# Patient Record
Sex: Female | Born: 1965 | Race: White | Hispanic: No | Smoking: Current every day smoker
Health system: Southern US, Community
[De-identification: ages and names within clinical notes are randomized; demographics above are authoritative.]

## PROBLEM LIST (undated history)

## (undated) DIAGNOSIS — IMO0002 Reserved for concepts with insufficient information to code with codable children: Secondary | ICD-10-CM

## (undated) DIAGNOSIS — M549 Dorsalgia, unspecified: Secondary | ICD-10-CM

## (undated) DIAGNOSIS — K859 Acute pancreatitis without necrosis or infection, unspecified: Secondary | ICD-10-CM

## (undated) DIAGNOSIS — G8929 Other chronic pain: Secondary | ICD-10-CM

## (undated) DIAGNOSIS — R109 Unspecified abdominal pain: Secondary | ICD-10-CM

## (undated) DIAGNOSIS — K529 Noninfective gastroenteritis and colitis, unspecified: Secondary | ICD-10-CM

## (undated) DIAGNOSIS — F1910 Other psychoactive substance abuse, uncomplicated: Secondary | ICD-10-CM

## (undated) DIAGNOSIS — M329 Systemic lupus erythematosus, unspecified: Secondary | ICD-10-CM

## (undated) DIAGNOSIS — J449 Chronic obstructive pulmonary disease, unspecified: Secondary | ICD-10-CM

## (undated) DIAGNOSIS — C55 Malignant neoplasm of uterus, part unspecified: Secondary | ICD-10-CM

## (undated) DIAGNOSIS — K279 Peptic ulcer, site unspecified, unspecified as acute or chronic, without hemorrhage or perforation: Secondary | ICD-10-CM

## (undated) DIAGNOSIS — D126 Benign neoplasm of colon, unspecified: Secondary | ICD-10-CM

## (undated) DIAGNOSIS — R112 Nausea with vomiting, unspecified: Secondary | ICD-10-CM

## (undated) HISTORY — PX: SHOULDER SURGERY: SHX246

---
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
## 1985-05-10 HISTORY — PX: CHOLECYSTECTOMY: SHX55

## 1994-05-10 DIAGNOSIS — C55 Malignant neoplasm of uterus, part unspecified: Secondary | ICD-10-CM

## 1994-05-10 HISTORY — PX: ABDOMINAL EXPLORATION SURGERY: SHX538

## 1994-05-10 HISTORY — PX: ABDOMINAL HYSTERECTOMY: SHX81

## 1994-05-10 HISTORY — DX: Malignant neoplasm of uterus, part unspecified: C55

## 2000-10-21 ENCOUNTER — Encounter: Payer: Self-pay | Admitting: *Deleted

## 2000-10-21 ENCOUNTER — Emergency Department (HOSPITAL_COMMUNITY): Admission: EM | Admit: 2000-10-21 | Discharge: 2000-10-21 | Payer: Self-pay | Admitting: *Deleted

## 2000-10-23 ENCOUNTER — Inpatient Hospital Stay (HOSPITAL_COMMUNITY): Admission: EM | Admit: 2000-10-23 | Discharge: 2000-10-28 | Payer: Self-pay | Admitting: Internal Medicine

## 2000-10-23 ENCOUNTER — Encounter: Payer: Self-pay | Admitting: Internal Medicine

## 2000-10-25 ENCOUNTER — Encounter: Payer: Self-pay | Admitting: General Surgery

## 2001-04-10 ENCOUNTER — Encounter: Payer: Self-pay | Admitting: Emergency Medicine

## 2001-04-11 ENCOUNTER — Encounter: Payer: Self-pay | Admitting: Family Medicine

## 2001-04-12 ENCOUNTER — Inpatient Hospital Stay (HOSPITAL_COMMUNITY): Admission: EM | Admit: 2001-04-12 | Discharge: 2001-04-14 | Payer: Self-pay | Admitting: Emergency Medicine

## 2002-01-29 ENCOUNTER — Inpatient Hospital Stay (HOSPITAL_COMMUNITY): Admission: EM | Admit: 2002-01-29 | Discharge: 2002-02-02 | Payer: Self-pay | Admitting: Emergency Medicine

## 2002-01-29 ENCOUNTER — Encounter: Payer: Self-pay | Admitting: Family Medicine

## 2003-02-14 ENCOUNTER — Encounter: Payer: Self-pay | Admitting: Emergency Medicine

## 2003-02-14 ENCOUNTER — Emergency Department (HOSPITAL_COMMUNITY): Admission: EM | Admit: 2003-02-14 | Discharge: 2003-02-14 | Payer: Self-pay | Admitting: Emergency Medicine

## 2010-02-07 DIAGNOSIS — D126 Benign neoplasm of colon, unspecified: Secondary | ICD-10-CM

## 2010-02-07 HISTORY — PX: ESOPHAGOGASTRODUODENOSCOPY: SHX1529

## 2010-02-07 HISTORY — PX: COLONOSCOPY W/ BIOPSIES: SHX1374

## 2010-02-07 HISTORY — DX: Benign neoplasm of colon, unspecified: D12.6

## 2010-05-10 DIAGNOSIS — K859 Acute pancreatitis without necrosis or infection, unspecified: Secondary | ICD-10-CM

## 2010-05-10 HISTORY — DX: Acute pancreatitis without necrosis or infection, unspecified: K85.90

## 2010-05-31 ENCOUNTER — Encounter: Payer: Self-pay | Admitting: Family Medicine

## 2010-11-08 DIAGNOSIS — K529 Noninfective gastroenteritis and colitis, unspecified: Secondary | ICD-10-CM

## 2010-11-08 HISTORY — DX: Noninfective gastroenteritis and colitis, unspecified: K52.9

## 2011-02-26 ENCOUNTER — Encounter (HOSPITAL_COMMUNITY): Payer: Self-pay | Admitting: *Deleted

## 2011-02-26 ENCOUNTER — Emergency Department (HOSPITAL_COMMUNITY)
Admission: EM | Admit: 2011-02-26 | Discharge: 2011-02-26 | Disposition: A | Discharge status: Home / Self Care | Payer: Self-pay | Attending: Emergency Medicine | Admitting: Emergency Medicine

## 2011-02-26 ENCOUNTER — Encounter: Payer: Self-pay | Admitting: Emergency Medicine

## 2011-02-26 DIAGNOSIS — F17200 Nicotine dependence, unspecified, uncomplicated: Secondary | ICD-10-CM | POA: Insufficient documentation

## 2011-02-26 DIAGNOSIS — K921 Melena: Secondary | ICD-10-CM | POA: Insufficient documentation

## 2011-02-26 DIAGNOSIS — R143 Flatulence: Secondary | ICD-10-CM | POA: Insufficient documentation

## 2011-02-26 DIAGNOSIS — M329 Systemic lupus erythematosus, unspecified: Secondary | ICD-10-CM | POA: Insufficient documentation

## 2011-02-26 DIAGNOSIS — R142 Eructation: Secondary | ICD-10-CM | POA: Insufficient documentation

## 2011-02-26 DIAGNOSIS — R1032 Left lower quadrant pain: Secondary | ICD-10-CM | POA: Insufficient documentation

## 2011-02-26 DIAGNOSIS — K5289 Other specified noninfective gastroenteritis and colitis: Secondary | ICD-10-CM | POA: Insufficient documentation

## 2011-02-26 DIAGNOSIS — K529 Noninfective gastroenteritis and colitis, unspecified: Secondary | ICD-10-CM

## 2011-02-26 DIAGNOSIS — R141 Gas pain: Secondary | ICD-10-CM | POA: Insufficient documentation

## 2011-02-26 DIAGNOSIS — R112 Nausea with vomiting, unspecified: Secondary | ICD-10-CM | POA: Insufficient documentation

## 2011-02-26 HISTORY — DX: Systemic lupus erythematosus, unspecified: M32.9

## 2011-02-26 HISTORY — DX: Noninfective gastroenteritis and colitis, unspecified: K52.9

## 2011-02-26 HISTORY — DX: Reserved for concepts with insufficient information to code with codable children: IMO0002

## 2011-02-26 LAB — DIFFERENTIAL
Basophils Absolute: 0 10*3/uL (ref 0.0–0.1)
Eosinophils Absolute: 0 10*3/uL (ref 0.0–0.7)
Lymphs Abs: 1.4 10*3/uL (ref 0.7–4.0)
Monocytes Absolute: 0.2 10*3/uL (ref 0.1–1.0)
Monocytes Relative: 2 % — ABNORMAL LOW (ref 3–12)
Neutro Abs: 8.1 10*3/uL — ABNORMAL HIGH (ref 1.7–7.7)

## 2011-02-26 LAB — BASIC METABOLIC PANEL
Calcium: 9.3 mg/dL (ref 8.4–10.5)
Creatinine, Ser: 0.53 mg/dL (ref 0.50–1.10)
GFR calc Af Amer: >90 mL/min (ref >90)
GFR calc non Af Amer: >90 mL/min (ref >90)
Sodium: 144 mEq/L (ref 135–145)

## 2011-02-26 LAB — LIPASE, BLOOD: Lipase: 18 U/L (ref 11–59)

## 2011-02-26 LAB — CBC
HCT: 44.7 % (ref 36.0–46.0)
MCH: 31.8 pg (ref 26.0–34.0)
MCHC: 34.5 g/dL (ref 30.0–36.0)
MCV: 92.4 fL (ref 78.0–100.0)
Platelets: 200 10*3/uL (ref 150–400)
RDW: 12 % (ref 11.5–15.5)
WBC: 9.7 10*3/uL (ref 4.0–10.5)

## 2011-02-26 MED ORDER — ONDANSETRON HCL 4 MG/2ML IJ SOLN
4.0000 mg | Freq: Once | INTRAMUSCULAR | Status: AC
  Administered 2011-02-26: 4 mg via INTRAVENOUS
  Filled 2011-02-26: qty 2

## 2011-02-26 MED ORDER — HYDROMORPHONE HCL 1 MG/ML IJ SOLN
1.0000 mg | Freq: Once | INTRAMUSCULAR | Status: AC
  Administered 2011-02-26: 1 mg via INTRAVENOUS
  Filled 2011-02-26: qty 1

## 2011-02-26 MED ORDER — CIPROFLOXACIN HCL 750 MG PO TABS: 750.0000 mg | ORAL_TABLET | Freq: Two times a day (BID) | ORAL | Status: AC

## 2011-02-26 MED ORDER — MORPHINE SULFATE 4 MG/ML IJ SOLN
4.0000 mg | Freq: Once | INTRAMUSCULAR | Status: AC
  Administered 2011-02-26: 4 mg via INTRAVENOUS
  Filled 2011-02-26: qty 1

## 2011-02-26 MED ORDER — OXYCODONE-ACETAMINOPHEN 5-325 MG PO TABS: 1.0000 | ORAL_TABLET | ORAL | Status: AC | PRN

## 2011-02-26 MED ORDER — METRONIDAZOLE 500 MG PO TABS: 500.0000 mg | ORAL_TABLET | Freq: Four times a day (QID) | ORAL | Status: AC

## 2011-02-26 MED ORDER — SODIUM CHLORIDE 0.9 % IV BOLUS (SEPSIS)
1000.0000 mL | Freq: Once | INTRAVENOUS | Status: AC
  Administered 2011-02-26: 1000 mL via INTRAVENOUS

## 2011-02-26 NOTE — ED Notes (Signed)
Pt a/ox4. Resp even and unlabored. NAD at this time. D/C instruction reviewed with pt and pt verbalized understanding. Pt to lobby with son to transport home.

## 2011-02-26 NOTE — ED Provider Notes (Signed)
History     CSN: [id number] Arrival date & time: 02/26/2011 11:58 AM   First MD Initiated Contact with Patient 02/26/11 1245      Chief Complaint  Patient presents with  . Abdominal Pain    (Consider location/radiation/quality/duration/timing/severity/associated sxs/prior treatment) Patient is a 45 y.o. female presenting with abdominal pain. The history is provided by the patient.  Abdominal Pain The primary symptoms of the illness include abdominal pain, fatigue, nausea, vomiting and diarrhea. The primary symptoms of the illness do not include shortness of breath or dysuria.  Symptoms associated with the illness do not include back pain.   patient states that she's had nausea vomiting and diarrhea for the last 2 days. She has a history of colitis, that she states was diagnosed in [place] in 6 months ago. She states that she's been told that she will have some blood in the stool occasionally. She states that she is going to see a gastroenterologist in town here now. No fevers. She has some left lower quadrant pain. His typical pain for her colitis. No trauma. Pain was gradual onset and has been constant. It is a dull pain. Sleep by nothing. She states she also has a history of lupus.  Past Medical History  Diagnosis Date  . Colitis   . Lupus     Past Surgical History  Procedure Date  . Abdominal hysterectomy   . Appendectomy   . Cholecystectomy   . Cesarean section   . Abdominal surgery     History reviewed. No pertinent family history.  History  Substance Use Topics  . Smoking status: Current Everyday Smoker  . Smokeless tobacco: Not on file  . Alcohol Use: No    OB History    Grav Para Term Preterm Abortions TAB SAB Ect Mult Living                  Review of Systems  Constitutional: Positive for appetite change and fatigue. Negative for activity change.  HENT: Negative for neck stiffness.   Eyes: Negative for pain.  Respiratory: Negative for chest tightness and  shortness of breath.   Cardiovascular: Negative for chest pain and leg swelling.  Gastrointestinal: Positive for nausea, vomiting, abdominal pain, diarrhea and blood in stool.  Genitourinary: Negative for dysuria and flank pain.  Musculoskeletal: Negative for back pain.  Skin: Negative for rash and wound.  Neurological: Negative for weakness, numbness and headaches.  Hematological: Negative for adenopathy. Does not bruise/bleed easily.  Psychiatric/Behavioral: Negative for behavioral problems.    Allergies  Ibuprofen; Penicillins; and Toradol  Home Medications   Current Outpatient Rx  Name Route Sig Dispense Refill  . ALPRAZOLAM 2 MG PO TABS Oral Take 2 mg by mouth 3 (three) times daily.      [name] AMITRIPTYLINE HCL 75 MG PO TABS Oral Take 75 mg by mouth at bedtime.      [name] CIPROFLOXACIN HCL 750 MG PO TABS Oral Take 1 tablet (750 mg total) by mouth 2 (two) times daily. 28 tablet 0  . METRONIDAZOLE 500 MG PO TABS Oral Take 1 tablet (500 mg total) by mouth 4 (four) times daily. 56 tablet 0  . OXYCODONE-ACETAMINOPHEN 5-325 MG PO TABS Oral Take 1 tablet by mouth every 4 (four) hours as needed for pain. 10 tablet 0    BP 116/55  Pulse 51  Temp 98.5 F (36.9 C)  Resp 18  Ht 5\' 8"  (1.727 m)  Wt 150 lb (68.04 kg)  BMI 22.81 kg/m2  SpO2 95%  Physical Exam  Nursing note and vitals reviewed. Constitutional: She is oriented to person, place, and time. She appears well-developed and well-nourished.  HENT:  Head: Normocephalic and atraumatic.  Eyes: EOM are normal. Pupils are equal, round, and reactive to light.  Neck: Normal range of motion. Neck supple.  Cardiovascular: Normal rate, regular rhythm and normal heart sounds.   No murmur heard. Pulmonary/Chest: Effort normal and breath sounds normal. No respiratory distress. She has no wheezes. She has no rales.  Abdominal: Soft. Bowel sounds are normal. She exhibits distension. There is no tenderness. There is no rebound and no guarding.        Mild left lower quadrant tenderness without rebound or guarding.  Musculoskeletal: Normal range of motion.  Neurological: She is alert and oriented to person, place, and time. No cranial nerve deficit.  Skin: Skin is warm and dry.  Psychiatric: She has a normal mood and affect. Her speech is normal.    ED Course  Procedures (including critical care time)  Labs Reviewed  CBC - Abnormal; Notable for the following:    Hemoglobin 15.4 (*)    All other components within normal limits  DIFFERENTIAL - Abnormal; Notable for the following:    Neutrophils Relative 84 (*)    Monocytes Relative 2 (*)    Neutro Abs 8.1 (*)    All other components within normal limits  BASIC METABOLIC PANEL - Abnormal; Notable for the following:    Glucose, Bld 111 (*)    All other components within normal limits  LIPASE, BLOOD   No results found.   1. Colitis      3pm. Patient now states that she has had pancrease problems when her colitis flares up. Will check a lipase.  MDM  Acute on chronic abdominal pain for the last 6 months. She states that she's been diagnosed with colitis an outside hospital after an extensive workup. States it flared up last few days. She doesn't know more about her colitis. She states that she has had nausea vomiting and some bloody diarrhea. No fevers. Lab work is reassuring here. The total white count is normal. Patient feels much better after treatment. She states that he needs she needs to follow with Dr. Dionicia Abler here. He has previously been a pain clinic but says that she wants to get off medicines now. I have not repeated imaging. Upon treating it like it is a diverticulitis. Her a few pain pills. She will need followup. Rectal exam was deferred due to a stable hemoglobin and patient preference.        Juliet Rude. Rubin Payor, MD 02/26/11 1554

## 2011-02-26 NOTE — ED Notes (Signed)
Pt c/o continued abdominal pain, and n/v. Was seen here earlier for the same.

## 2011-02-26 NOTE — ED Notes (Signed)
Pt. States nausea and pain increased.  MD notified and orders received.

## 2011-02-26 NOTE — ED Notes (Signed)
Pt. States pain is unchanged with pain medication and EDP notified.

## 2011-02-26 NOTE — ED Notes (Signed)
Patient does not need anything at this time. 

## 2011-02-26 NOTE — ED Notes (Signed)
Pt c/o n/v/d with blood in stool and abd pain. H/s colitis.

## 2011-02-27 ENCOUNTER — Emergency Department (HOSPITAL_COMMUNITY): Payer: Self-pay

## 2011-02-27 ENCOUNTER — Emergency Department (HOSPITAL_COMMUNITY)
Admission: EM | Admit: 2011-02-27 | Discharge: 2011-02-27 | Disposition: A | Discharge status: Home / Self Care | Payer: Self-pay | Attending: Emergency Medicine | Admitting: Emergency Medicine

## 2011-02-27 ENCOUNTER — Encounter (HOSPITAL_COMMUNITY): Payer: Self-pay | Admitting: *Deleted

## 2011-02-27 DIAGNOSIS — F17200 Nicotine dependence, unspecified, uncomplicated: Secondary | ICD-10-CM | POA: Insufficient documentation

## 2011-02-27 DIAGNOSIS — R197 Diarrhea, unspecified: Secondary | ICD-10-CM | POA: Insufficient documentation

## 2011-02-27 DIAGNOSIS — R1013 Epigastric pain: Secondary | ICD-10-CM | POA: Insufficient documentation

## 2011-02-27 DIAGNOSIS — R1032 Left lower quadrant pain: Secondary | ICD-10-CM | POA: Insufficient documentation

## 2011-02-27 DIAGNOSIS — R112 Nausea with vomiting, unspecified: Secondary | ICD-10-CM | POA: Insufficient documentation

## 2011-02-27 DIAGNOSIS — K5289 Other specified noninfective gastroenteritis and colitis: Secondary | ICD-10-CM | POA: Insufficient documentation

## 2011-02-27 DIAGNOSIS — M329 Systemic lupus erythematosus, unspecified: Secondary | ICD-10-CM | POA: Insufficient documentation

## 2011-02-27 LAB — CBC
HCT: 44.6 % (ref 36.0–46.0)
HCT: 38.2 % (ref 36.0–46.0)
MCH: 32 pg (ref 26.0–34.0)
MCHC: 33.2 g/dL (ref 30.0–36.0)
MCV: 92.5 fL (ref 78.0–100.0)
Platelets: 248 10*3/uL (ref 150–400)
Platelets: 233 10*3/uL (ref 150–400)
RBC: 4.13 MIL/uL (ref 3.87–5.11)
RDW: 12.1 % (ref 11.5–15.5)
WBC: 8 10*3/uL (ref 4.0–10.5)

## 2011-02-27 LAB — COMPREHENSIVE METABOLIC PANEL
ALT: 13 U/L (ref 0–35)
AST: 15 U/L (ref 0–37)
Albumin: 3.8 g/dL (ref 3.5–5.2)
Alkaline Phosphatase: 59 U/L (ref 39–117)
Chloride: 107 mEq/L (ref 96–112)
Potassium: 3.3 mEq/L — ABNORMAL LOW (ref 3.5–5.1)
Sodium: 140 mEq/L (ref 135–145)
Total Bilirubin: 0.3 mg/dL (ref 0.3–1.2)
Total Protein: 6.9 g/dL (ref 6.0–8.3)

## 2011-02-27 LAB — BASIC METABOLIC PANEL
CO2: 25 mEq/L (ref 19–32)
Calcium: 9.3 mg/dL (ref 8.4–10.5)
Chloride: 106 mEq/L (ref 96–112)
Creatinine, Ser: 0.59 mg/dL (ref 0.50–1.10)
GFR calc Af Amer: >90 mL/min (ref >90)
Sodium: 142 mEq/L (ref 135–145)

## 2011-02-27 LAB — DIFFERENTIAL
Band Neutrophils: 0 % (ref 0–10)
Basophils Absolute: 0 10*3/uL (ref 0.0–0.1)
Basophils Absolute: 0 10*3/uL (ref 0.0–0.1)
Basophils Relative: 0 % (ref 0–1)
Eosinophils Absolute: 0 10*3/uL (ref 0.0–0.7)
Eosinophils Relative: 0 % (ref 0–5)
Eosinophils Relative: 0 % (ref 0–5)
Lymphocytes Relative: 15 % (ref 12–46)
Metamyelocytes Relative: 0 %
Monocytes Absolute: 0.2 10*3/uL (ref 0.1–1.0)
Monocytes Absolute: 0.2 10*3/uL (ref 0.1–1.0)
Myelocytes: 0 %
Neutro Abs: 6.6 10*3/uL (ref 1.7–7.7)
nRBC: 0 /100 WBC

## 2011-02-27 LAB — URINALYSIS, ROUTINE W REFLEX MICROSCOPIC
Glucose, UA: NEGATIVE mg/dL
Hgb urine dipstick: NEGATIVE
Ketones, ur: 15 mg/dL — AB
Leukocytes, UA: NEGATIVE
Protein, ur: NEGATIVE mg/dL
pH: 6.5 (ref 5.0–8.0)

## 2011-02-27 MED ORDER — SODIUM CHLORIDE 0.9 % IV SOLN: INTRAVENOUS | Status: DC

## 2011-02-27 MED ORDER — ALBUTEROL SULFATE (5 MG/ML) 0.5% IN NEBU
2.5000 mg | INHALATION_SOLUTION | Freq: Once | RESPIRATORY_TRACT | Status: AC
  Administered 2011-02-27: 2.5 mg via RESPIRATORY_TRACT
  Filled 2011-02-27: qty 0.5

## 2011-02-27 MED ORDER — ONDANSETRON HCL 4 MG/2ML IJ SOLN
4.0000 mg | Freq: Once | INTRAMUSCULAR | Status: AC
  Administered 2011-02-27: 4 mg via INTRAVENOUS
  Filled 2011-02-27: qty 2

## 2011-02-27 MED ORDER — HYDROCODONE-ACETAMINOPHEN 5-325 MG PO TABS: 1.0000 | ORAL_TABLET | ORAL | Status: AC | PRN

## 2011-02-27 MED ORDER — HYDROMORPHONE HCL 1 MG/ML IJ SOLN
1.0000 mg | Freq: Once | INTRAMUSCULAR | Status: AC
  Administered 2011-02-27: 1 mg via INTRAVENOUS
  Filled 2011-02-27: qty 1

## 2011-02-27 MED ORDER — METRONIDAZOLE 500 MG PO TABS
500.0000 mg | ORAL_TABLET | Freq: Once | ORAL | Status: AC
  Administered 2011-02-27: 500 mg via ORAL
  Filled 2011-02-27: qty 1

## 2011-02-27 MED ORDER — HYDROCODONE-ACETAMINOPHEN 5-325 MG PO TABS: 1.0000 | ORAL_TABLET | ORAL | Status: DC | PRN

## 2011-02-27 MED ORDER — SODIUM CHLORIDE 0.9 % IV BOLUS (SEPSIS)
1000.0000 mL | Freq: Once | INTRAVENOUS | Status: AC
  Administered 2011-02-27: 1000 mL via INTRAVENOUS

## 2011-02-27 MED ORDER — PANTOPRAZOLE SODIUM 40 MG IV SOLR: 40.0000 mg | Freq: Once | INTRAVENOUS | Status: DC

## 2011-02-27 MED ORDER — SODIUM CHLORIDE 0.9 % IV BOLUS (SEPSIS): 1000.0000 mL | Freq: Once | INTRAVENOUS | Status: DC

## 2011-02-27 MED ORDER — SODIUM CHLORIDE 0.9 % IV SOLN
Freq: Once | INTRAVENOUS | Status: AC
  Administered 2011-02-27: 02:00:00 via INTRAVENOUS

## 2011-02-27 MED ORDER — ONDANSETRON HCL 4 MG PO TABS: 4.0000 mg | ORAL_TABLET | Freq: Four times a day (QID) | ORAL | Status: AC

## 2011-02-27 MED ORDER — IOHEXOL 300 MG/ML  SOLN
100.0000 mL | Freq: Once | INTRAMUSCULAR | Status: AC | PRN
  Administered 2011-02-27: 100 mL via INTRAVENOUS

## 2011-02-27 MED ORDER — SODIUM CHLORIDE 0.9 % IV SOLN
Freq: Once | INTRAVENOUS | Status: AC
  Administered 2011-02-27: 05:00:00 via INTRAVENOUS

## 2011-02-27 MED ORDER — ONDANSETRON HCL 4 MG PO TABS: 4.0000 mg | ORAL_TABLET | Freq: Four times a day (QID) | ORAL | Status: DC

## 2011-02-27 MED ORDER — CIPROFLOXACIN HCL 250 MG PO TABS
500.0000 mg | ORAL_TABLET | Freq: Once | ORAL | Status: AC
  Administered 2011-02-27: 500 mg via ORAL
  Filled 2011-02-27: qty 2

## 2011-02-27 MED ORDER — PANTOPRAZOLE SODIUM 40 MG IV SOLR
40.0000 mg | Freq: Once | INTRAVENOUS | Status: AC
  Administered 2011-02-27: 40 mg via INTRAVENOUS
  Filled 2011-02-27: qty 40

## 2011-02-27 MED ORDER — IPRATROPIUM BROMIDE 0.02 % IN SOLN
0.5000 mg | Freq: Once | RESPIRATORY_TRACT | Status: AC
  Administered 2011-02-27: 0.5 mg via RESPIRATORY_TRACT
  Filled 2011-02-27: qty 2.5

## 2011-02-27 NOTE — ED Notes (Signed)
Pain at 5 and nausea is eased.  Asking for another liter of fld.

## 2011-02-27 NOTE — ED Notes (Signed)
Alert, talking, Pt has vomited x1 in tx room, app 50 cc green fld app1.5 hours ago.

## 2011-02-27 NOTE — ED Notes (Signed)
Pt c/o abd pain with n/v/d; pt states she was just seen this am for same complaint; pt states she startes vomiting after she wakes up

## 2011-02-27 NOTE — ED Notes (Signed)
Alert at time of d/c, called for ride.  Had been sleeping.  IV removed and will wait in lobby for her ride.

## 2011-02-27 NOTE — ED Notes (Signed)
Alert, talking, no distress at present.  Son at bedside.

## 2011-02-27 NOTE — ED Notes (Signed)
Pain at 8 and cont to have nausea.

## 2011-02-27 NOTE — ED Provider Notes (Signed)
History     CSN: [id number] Arrival date & time: 02/27/2011  4:51 PM   First MD Initiated Contact with Patient 02/27/11 1656      Chief Complaint  Patient presents with  . Abdominal Pain  . Nausea  . Emesis  . Diarrhea    (Consider location/radiation/quality/duration/timing/severity/associated sxs/prior treatment) HPI... left lower quadrant pain for several days.  Was in emergency department earlier today. Has history of diverticulitis and colitis. No fever, chills, dysuria, vomiting, diarrhea. He makes pain better or worse. No radiation of pain. Pain is vague in nature and moderate in intensity  Past Medical History  Diagnosis Date  . Colitis   . Lupus     Past Surgical History  Procedure Date  . Abdominal hysterectomy   . Appendectomy   . Cholecystectomy   . Cesarean section   . Abdominal surgery     History reviewed. No pertinent family history.  History  Substance Use Topics  . Smoking status: Current Everyday Smoker  . Smokeless tobacco: Not on file  . Alcohol Use: No    OB History    Grav Para Term Preterm Abortions TAB SAB Ect Mult Living                  Review of Systems  All other systems reviewed and are negative.    Allergies  Honey bee venom; Ibuprofen; Penicillins; and Toradol  Home Medications   Current Outpatient Rx  Name Route Sig Dispense Refill  . ALBUTEROL SULFATE HFA 108 (90 BASE) MCG/ACT IN AERS Inhalation Inhale 2 puffs into the lungs every 6 (six) hours as needed. For shortness of breath     . ALPRAZOLAM 2 MG PO TABS Oral Take 2 mg by mouth 3 (three) times daily.      [name] AMITRIPTYLINE HCL 75 MG PO TABS Oral Take 75 mg by mouth at bedtime as needed. For sleep    . CIPROFLOXACIN HCL 750 MG PO TABS Oral Take 1 tablet (750 mg total) by mouth 2 (two) times daily. 28 tablet 0  . PROMETHAZINE HCL 25 MG RE SUPP Rectal Place 25 mg rectally every 6 (six) hours as needed. For nausea     . HYDROCODONE-ACETAMINOPHEN 5-325 MG PO TABS Oral  Take 1-2 tablets by mouth every 4 (four) hours as needed for pain. 20 tablet 0  . METRONIDAZOLE 500 MG PO TABS Oral Take 1 tablet (500 mg total) by mouth 4 (four) times daily. 56 tablet 0  . ONDANSETRON HCL 4 MG PO TABS Oral Take 1 tablet (4 mg total) by mouth every 6 (six) hours. 12 tablet 0  . OXYCODONE-ACETAMINOPHEN 5-325 MG PO TABS Oral Take 1 tablet by mouth every 4 (four) hours as needed for pain. 10 tablet 0    BP 128/69  Pulse 50  Temp(Src) 99.2 F (37.3 C) (Oral)  Resp 20  SpO2 98%  Physical Exam  Nursing note and vitals reviewed. Constitutional: She is oriented to person, place, and time. She appears well-developed and well-nourished.  HENT:  Head: Normocephalic and atraumatic.  Eyes: Conjunctivae and EOM are normal. Pupils are equal, round, and reactive to light.  Neck: Normal range of motion. Neck supple.  Cardiovascular: Normal rate and regular rhythm.   Pulmonary/Chest: Effort normal and breath sounds normal.  Abdominal: Soft. Bowel sounds are normal.       Minimal left lower quadrant tenderness, no guarding  Musculoskeletal: Normal range of motion.  Neurological: She is alert and oriented to person, place, and  time.  Skin: Skin is warm and dry.  Psychiatric: She has a normal mood and affect.    ED Course  Procedures (including critical care time)  Labs Reviewed  COMPREHENSIVE METABOLIC PANEL - Abnormal; Notable for the following:    Potassium 3.3 (*)    All other components within normal limits  URINALYSIS, ROUTINE W REFLEX MICROSCOPIC - Abnormal; Notable for the following:    Ketones, ur 15 (*)    All other components within normal limits  CBC  DIFFERENTIAL  LIPASE, BLOOD   Ct Abdomen Pelvis W Contrast  02/27/2011  *RADIOLOGY REPORT*  Clinical Data: Epigastric abdominal pain with nausea and diarrhea. Question diverticulitis.  Post hysterectomy.  CT ABDOMEN AND PELVIS WITH CONTRAST  Technique:  Multidetector CT imaging of the abdomen and pelvis was  performed following the standard protocol during bolus administration of intravenous contrast.  Contrast: OMNIPAQUE IOHEXOL 300 MG/ML IV SOLN  Comparison: None available.  Findings: Minimal atelectatic changes right lung base.  No free intraperitoneal air.  Post cholecystectomy.  Mild fatty infiltration liver.  No focal hepatic lesion.  No hydronephrosis or focal renal lesion.  No splenic or pancreatic lesion.  No extraluminal bowel inflammatory process or free fluid. Small amount of fluid within the duodenum slightly heterogeneous but without discrete ulcer.  Atherosclerotic type changes of the aorta without aneurysmal dilation.  Atherosclerotic type changes iliac arteries.  No pelvic or abdominal adenopathy.  Post hysterectomy.  Left pelvic side wall the 4.2 x 2.7 cm structure may be related to the ovary and be further assessed on follow-up ultrasound in 2 months to confirm resolution.  Post hysterectomy seroma/lymphocele less likely consideration.  No bony destructive changes.  Degenerative changes lower lumbar spine.  IMPRESSION: No extraluminal bowel inflammatory process, free fluid or free air noted.  Appendix not visualized.  Nonspecific left pelvic sidewall 4.2 x 2.7 cm structure may be related to the ovary and can be assessed on follow-up ultrasound 2 months.  Please see above.  Original Report Authenticated By: Fuller Canada, M.D.     1. Abdominal pain       MDM  CT scan shows a 4 x 2.7 centimeter area in left pelvis. This was discussed with the patient and her husband.  Patient understands she needs an ultrasound in 2 months. Medications for pain and nausea. No acute abdomen on discharge      .[name]  Donnetta Hutching, MD 02/27/11 2221

## 2011-02-27 NOTE — ED Provider Notes (Signed)
History     CSN: [id number] Arrival date & time: 02/27/2011 12:15 AM   First MD Initiated Contact with Patient 02/27/11 0054      Chief Complaint  Patient presents with  . Abdominal Pain  . Nausea  . Emesis    (Consider location/radiation/quality/duration/timing/severity/associated sxs/prior treatment) HPI Comments: Seen 81 Patient with a history of colitis diagnosed in [place], [place]  6 months ago who was seen here yesterday afternoon for abdominal pain, nausea and vomiting that she had been experiencing for the last two days. She was treated with IVF, analgesics, antiemetics. Labs were unremarkable. She was discharged with Rx for analgesics, antibiotics, antiemetics. She states she went home and began vomiting again and was unable to take any of the medicines. Her pain is in the LLQ and is typical of her colitis pain. She denies any fever, chills, chest pain, shortness of breath. She states she felt better when she was in the ER earlier. Additional medical history includes lupus. She has had C-section, chole, apendectomy, hysterectomy.   Patient is a 45 y.o. female presenting with abdominal pain. The history is provided by the patient.  Abdominal Pain The primary symptoms of the illness include abdominal pain, nausea and vomiting. The primary symptoms of the illness do not include fever. The current episode started more than 2 days ago. The onset of the illness was gradual. The problem has been gradually worsening.  Symptoms associated with the illness do not include chills. Associated symptoms comments: Nausea and vomiting.    Past Medical History  Diagnosis Date  . Colitis   . Lupus     Past Surgical History  Procedure Date  . Abdominal hysterectomy   . Appendectomy   . Cholecystectomy   . Cesarean section   . Abdominal surgery     History reviewed. No pertinent family history.  History  Substance Use Topics  . Smoking status: Current Everyday Smoker  . Smokeless  tobacco: Not on file  . Alcohol Use: No    OB History    Grav Para Term Preterm Abortions TAB SAB Ect Mult Living                  Review of Systems  Constitutional: Negative for fever and chills.  Gastrointestinal: Positive for nausea, vomiting, abdominal pain, blood in stool and abdominal distention.  All other systems reviewed and are negative.    Allergies  Ibuprofen; Penicillins; and Toradol  Home Medications   Current Outpatient Rx  Name Route Sig Dispense Refill  . ALPRAZOLAM 2 MG PO TABS Oral Take 2 mg by mouth 3 (three) times daily.      [name] AMITRIPTYLINE HCL 75 MG PO TABS Oral Take 75 mg by mouth at bedtime.      [name] CIPROFLOXACIN HCL 750 MG PO TABS Oral Take 1 tablet (750 mg total) by mouth 2 (two) times daily. 28 tablet 0  . METRONIDAZOLE 500 MG PO TABS Oral Take 1 tablet (500 mg total) by mouth 4 (four) times daily. 56 tablet 0  . OXYCODONE-ACETAMINOPHEN 5-325 MG PO TABS Oral Take 1 tablet by mouth every 4 (four) hours as needed for pain. 10 tablet 0    BP 109/56  Pulse 85  Temp(Src) 99.1 F (37.3 C) (Oral)  Resp 20  SpO2 97%  Physical Exam  Nursing note and vitals reviewed. Constitutional: She is oriented to person, place, and time. She appears well-developed and well-nourished. No distress.  HENT:  Head: Normocephalic and atraumatic.  Right Ear:  External ear normal.  Left Ear: External ear normal.  Mouth/Throat: Oropharynx is clear and moist.  Eyes: EOM are normal.  Neck: Normal range of motion. Neck supple.  Cardiovascular: Regular rhythm, normal heart sounds and intact distal pulses.   Pulmonary/Chest: Effort normal and breath sounds normal.  Abdominal: She exhibits no distension and no mass. There is tenderness. There is no rebound and no guarding.       Left lower quadrant tenderness without rebound or guarding.  Musculoskeletal: Normal range of motion.  Neurological: She is alert and oriented to person, place, and time.  Skin: Skin is warm and  dry.    ED Course  Procedures (including critical care time) Results for orders placed during the hospital encounter of 02/27/11  CBC      Component Value Range   WBC 8.0  4.0 - 10.5 (K/uL)   RBC 4.81  3.87 - 5.11 (MIL/uL)   Hemoglobin 14.8  12.0 - 15.0 (g/dL)   HCT 04.5  40.9 - 81.1 (%)   MCV 92.7  78.0 - 100.0 (fL)   MCH 30.8  26.0 - 34.0 (pg)   MCHC 33.2  30.0 - 36.0 (g/dL)   RDW 91.4  78.2 - 95.6 (%)   Platelets 248  150 - 400 (K/uL)  DIFFERENTIAL      Component Value Range   Neutrophils Relative 83 (*) 43 - 77 (%)   Neutro Abs 6.6  1.7 - 7.7 (K/uL)   Lymphocytes Relative 15  12 - 46 (%)   Lymphs Abs 1.2  0.7 - 4.0 (K/uL)   Monocytes Relative 2 (*) 3 - 12 (%)   Monocytes Absolute 0.2  0.1 - 1.0 (K/uL)   Eosinophils Relative 0  0 - 5 (%)   Eosinophils Absolute 0.0  0.0 - 0.7 (K/uL)   Basophils Relative 0  0 - 1 (%)   Basophils Absolute 0.0  0.0 - 0.1 (K/uL)  BASIC METABOLIC PANEL      Component Value Range   Sodium 142  135 - 145 (mEq/L)   Potassium 3.9  3.5 - 5.1 (mEq/L)   Chloride 106  96 - 112 (mEq/L)   CO2 25  19 - 32 (mEq/L)   Glucose, Bld 116 (*) 70 - 99 (mg/dL)   BUN 15  6 - 23 (mg/dL)   Creatinine, Ser 2.13  0.50 - 1.10 (mg/dL)   Calcium 9.3  8.4 - 08.6 (mg/dL)   GFR calc non Af Amer >90  >90 (mL/min)   GFR calc Af Amer >90  >90 (mL/min)   visit    MDM  Patient with h/o colitis here with recurrent pain. Seen in the ER less than 12 hours ago with the same c/o. Unable to keep down medications. This visit given IVF, antiemetics, analgesics, and begun on antibiotics for colitis. Patient able to keep down fluids while here. Ambulated to the bathroom without assistance. Labs were repeated which were unchanged. Pt stable in ED with no significant deterioration in condition.The patient appears reasonably screened and/or stabilized for discharge and I doubt any other medical condition or other [facility] requiring further screening, evaluation, or treatment in the ED at this  time prior to discharge. MDM Reviewed: nursing note, vitals and previous chart Reviewed previous: labs Interpretation: labs Total time providing critical care: 40.           Nicoletta Dress. Colon Branch, MD 02/27/11 [phone]

## 2011-02-27 NOTE — ED Notes (Signed)
Seen here earlier for colitis.  Says she started vomiting after leaving ER.  Took phenergan suppository , and cont to vomit.  Alert, cooperative.

## 2011-05-16 ENCOUNTER — Emergency Department (HOSPITAL_COMMUNITY)
Admission: EM | Admit: 2011-05-16 | Discharge: 2011-05-16 | Disposition: A | Discharge status: Home / Self Care | Payer: Self-pay | Attending: Emergency Medicine | Admitting: Emergency Medicine

## 2011-05-16 ENCOUNTER — Emergency Department (HOSPITAL_COMMUNITY): Payer: Self-pay

## 2011-05-16 ENCOUNTER — Encounter (HOSPITAL_COMMUNITY): Payer: Self-pay | Admitting: *Deleted

## 2011-05-16 DIAGNOSIS — F17200 Nicotine dependence, unspecified, uncomplicated: Secondary | ICD-10-CM | POA: Insufficient documentation

## 2011-05-16 DIAGNOSIS — Z9079 Acquired absence of other genital organ(s): Secondary | ICD-10-CM | POA: Insufficient documentation

## 2011-05-16 DIAGNOSIS — R10814 Left lower quadrant abdominal tenderness: Secondary | ICD-10-CM | POA: Insufficient documentation

## 2011-05-16 DIAGNOSIS — K5289 Other specified noninfective gastroenteritis and colitis: Secondary | ICD-10-CM | POA: Insufficient documentation

## 2011-05-16 DIAGNOSIS — R1032 Left lower quadrant pain: Secondary | ICD-10-CM | POA: Insufficient documentation

## 2011-05-16 DIAGNOSIS — R197 Diarrhea, unspecified: Secondary | ICD-10-CM | POA: Insufficient documentation

## 2011-05-16 DIAGNOSIS — R112 Nausea with vomiting, unspecified: Secondary | ICD-10-CM | POA: Insufficient documentation

## 2011-05-16 DIAGNOSIS — M329 Systemic lupus erythematosus, unspecified: Secondary | ICD-10-CM | POA: Insufficient documentation

## 2011-05-16 HISTORY — DX: Reserved for concepts with insufficient information to code with codable children: IMO0002

## 2011-05-16 LAB — DIFFERENTIAL
Basophils Relative: 0 % (ref 0–1)
Eosinophils Absolute: 0 10*3/uL (ref 0.0–0.7)
Lymphs Abs: 1.5 10*3/uL (ref 0.7–4.0)
Monocytes Absolute: 0.2 10*3/uL (ref 0.1–1.0)
Monocytes Relative: 2 % — ABNORMAL LOW (ref 3–12)
Neutro Abs: 9.3 10*3/uL — ABNORMAL HIGH (ref 1.7–7.7)
Neutrophils Relative %: 84 % — ABNORMAL HIGH (ref 43–77)

## 2011-05-16 LAB — COMPREHENSIVE METABOLIC PANEL
Albumin: 5 g/dL (ref 3.5–5.2)
Alkaline Phosphatase: 84 U/L (ref 39–117)
BUN: 17 mg/dL (ref 6–23)
Chloride: 98 mEq/L (ref 96–112)
Creatinine, Ser: 0.55 mg/dL (ref 0.50–1.10)
GFR calc Af Amer: >90 mL/min (ref >90)
Glucose, Bld: 138 mg/dL — ABNORMAL HIGH (ref 70–99)
Potassium: 4.8 mEq/L (ref 3.5–5.1)
Total Bilirubin: 0.4 mg/dL (ref 0.3–1.2)

## 2011-05-16 LAB — URINE MICROSCOPIC-ADD ON

## 2011-05-16 LAB — CBC
HCT: 48 % — ABNORMAL HIGH (ref 36.0–46.0)
Hemoglobin: 16.5 g/dL — ABNORMAL HIGH (ref 12.0–15.0)
MCH: 31.3 pg (ref 26.0–34.0)
MCHC: 34.4 g/dL (ref 30.0–36.0)
RBC: 5.27 MIL/uL — ABNORMAL HIGH (ref 3.87–5.11)

## 2011-05-16 LAB — URINALYSIS, ROUTINE W REFLEX MICROSCOPIC
Ketones, ur: >80 mg/dL — AB
Leukocytes, UA: NEGATIVE
Nitrite: NEGATIVE
Protein, ur: 30 mg/dL — AB
Urobilinogen, UA: 0.2 mg/dL (ref 0.0–1.0)

## 2011-05-16 MED ORDER — METOCLOPRAMIDE HCL 5 MG/ML IJ SOLN
10.0000 mg | Freq: Once | INTRAMUSCULAR | Status: AC
  Administered 2011-05-16: 10 mg via INTRAVENOUS

## 2011-05-16 MED ORDER — OXYCODONE-ACETAMINOPHEN 5-325 MG PO TABS: 2.0000 | ORAL_TABLET | ORAL | Status: AC | PRN

## 2011-05-16 MED ORDER — IOHEXOL 300 MG/ML  SOLN
100.0000 mL | Freq: Once | INTRAMUSCULAR | Status: AC | PRN
  Administered 2011-05-16: 100 mL via INTRAVENOUS

## 2011-05-16 MED ORDER — HYDROMORPHONE HCL PF 1 MG/ML IJ SOLN
1.0000 mg | Freq: Once | INTRAMUSCULAR | Status: AC
  Administered 2011-05-16: 1 mg via INTRAVENOUS
  Filled 2011-05-16: qty 1

## 2011-05-16 MED ORDER — METOCLOPRAMIDE HCL 5 MG/ML IJ SOLN
INTRAMUSCULAR | Status: AC
  Filled 2011-05-16: qty 2

## 2011-05-16 MED ORDER — ONDANSETRON HCL 4 MG/2ML IJ SOLN
4.0000 mg | Freq: Once | INTRAMUSCULAR | Status: AC
  Administered 2011-05-16: 4 mg via INTRAVENOUS
  Filled 2011-05-16: qty 2

## 2011-05-16 MED ORDER — PANTOPRAZOLE SODIUM 40 MG IV SOLR
40.0000 mg | Freq: Once | INTRAVENOUS | Status: AC
  Administered 2011-05-16: 40 mg via INTRAVENOUS
  Filled 2011-05-16: qty 40

## 2011-05-16 MED ORDER — SODIUM CHLORIDE 0.45 % IV SOLN: INTRAVENOUS | Status: DC

## 2011-05-16 MED ORDER — SODIUM CHLORIDE 0.9 % IV BOLUS (SEPSIS)
1000.0000 mL | Freq: Once | INTRAVENOUS | Status: AC
  Administered 2011-05-16: 1000 mL via INTRAVENOUS

## 2011-05-16 MED ORDER — HYDROMORPHONE HCL PF 1 MG/ML IJ SOLN
INTRAMUSCULAR | Status: AC
  Filled 2011-05-16: qty 1

## 2011-05-16 MED ORDER — HYDROMORPHONE HCL PF 1 MG/ML IJ SOLN
1.0000 mg | Freq: Once | INTRAMUSCULAR | Status: AC
  Administered 2011-05-16: 1 mg via INTRAVENOUS

## 2011-05-16 MED ORDER — PROMETHAZINE HCL 25 MG PO TABS: 12.5000 mg | ORAL_TABLET | Freq: Four times a day (QID) | ORAL | Status: DC | PRN

## 2011-05-16 MED ORDER — OXYCODONE-ACETAMINOPHEN 5-325 MG PO TABS: 1.0000 | ORAL_TABLET | ORAL | Status: AC | PRN

## 2011-05-16 NOTE — ED Provider Notes (Signed)
History   This chart was scribed for [employer]. Colon Branch, MD by Sofie Rower. The patient was seen in room APA09/APA09 and the patient's care was started at 3:20PM.    CSN: [id number]  Arrival date & time 05/16/11  1426   First MD Initiated Contact with Patient 05/16/11 1505      No chief complaint on file.   (Consider location/radiation/quality/duration/timing/severity/associated sxs/prior treatment) HPI  Andrea Nelson is a 46 y.o. female who presents to the Emergency Department complaining of moderate, constant abdominal pain onset one week ago located in the LLQ with associated symptoms of nausea, vomiting, diarrhea, trouble with bowel movements. Pt has a history of colitis, colonoscopy (1 yr ago October) and hysterectomy. Pt denies any other medical problems.  PCP is Dr. Sherwood Gambler. OB/GYN is Dr. Emelda Fear.   Past Medical History  Diagnosis Date  . Colitis   . Lupus     Past Surgical History  Procedure Date  . Abdominal hysterectomy   . Appendectomy   . Cholecystectomy   . Cesarean section   . Abdominal surgery     No family history on file.  History  Substance Use Topics  . Smoking status: Current Everyday Smoker  . Smokeless tobacco: Not on file  . Alcohol Use: No    OB History    Grav Para Term Preterm Abortions TAB SAB Ect Mult Living                  Review of Systems  10 Systems reviewed and are negative for acute change except as noted in the HPI.   Allergies  Honey bee venom; Ibuprofen; Penicillins; and Toradol  Home Medications   Current Outpatient Rx  Name Route Sig Dispense Refill  . ALBUTEROL SULFATE HFA 108 (90 BASE) MCG/ACT IN AERS Inhalation Inhale 2 puffs into the lungs every 6 (six) hours as needed. For shortness of breath     . ALPRAZOLAM 2 MG PO TABS Oral Take 2 mg by mouth 3 (three) times daily.      [name] AMITRIPTYLINE HCL 75 MG PO TABS Oral Take 75 mg by mouth at bedtime as needed. For sleep    . PROMETHAZINE HCL 25 MG RE SUPP Rectal Place 25  mg rectally every 6 (six) hours as needed. For nausea       There were no vitals taken for this visit.  Physical Exam  Nursing note and vitals reviewed. Constitutional: She is oriented to person, place, and time. She appears well-developed and well-nourished. No distress.  HENT:  Head: Normocephalic and atraumatic.  Nose: Nose normal.  Eyes: EOM are normal. Pupils are equal, round, and reactive to light.  Neck: Normal range of motion. Neck supple. No tracheal deviation present.  Cardiovascular: Normal rate, regular rhythm and normal heart sounds.   Pulmonary/Chest: Effort normal and breath sounds normal. No respiratory distress.  Abdominal: Soft. Bowel sounds are normal. She exhibits no distension. There is tenderness. There is no rebound and no guarding.       Located in the LLQ.  Musculoskeletal: Normal range of motion. She exhibits no edema.  Neurological: She is alert and oriented to person, place, and time. No sensory deficit.  Skin: Skin is warm and dry.  Psychiatric: She has a normal mood and affect. Her behavior is normal.    ED Course  Procedures (including critical care time)  DIAGNOSTIC STUDIES: Oxygen Saturation is 98% on room air, normal by my interpretation.    COORDINATION OF CARE:  Results for orders placed during the hospital encounter of 05/16/11  CBC      Component Value Range   WBC 11.0 (*) 4.0 - 10.5 (K/uL)   RBC 5.27 (*) 3.87 - 5.11 (MIL/uL)   Hemoglobin 16.5 (*) 12.0 - 15.0 (g/dL)   HCT 29.5 (*) 28.4 - 46.0 (%)   MCV 91.1  78.0 - 100.0 (fL)   MCH 31.3  26.0 - 34.0 (pg)   MCHC 34.4  30.0 - 36.0 (g/dL)   RDW 13.2  44.0 - 10.2 (%)   Platelets 303  150 - 400 (K/uL)  DIFFERENTIAL      Component Value Range   Neutrophils Relative 84 (*) 43 - 77 (%)   Neutro Abs 9.3 (*) 1.7 - 7.7 (K/uL)   Lymphocytes Relative 14  12 - 46 (%)   Lymphs Abs 1.5  0.7 - 4.0 (K/uL)   Monocytes Relative 2 (*) 3 - 12 (%)   Monocytes Absolute 0.2  0.1 - 1.0 (K/uL)    Eosinophils Relative 0  0 - 5 (%)   Eosinophils Absolute 0.0  0.0 - 0.7 (K/uL)   Basophils Relative 0  0 - 1 (%)   Basophils Absolute 0.0  0.0 - 0.1 (K/uL)  COMPREHENSIVE METABOLIC PANEL      Component Value Range   Sodium 135  135 - 145 (mEq/L)   Potassium 4.8  3.5 - 5.1 (mEq/L)   Chloride 98  96 - 112 (mEq/L)   CO2 26  19 - 32 (mEq/L)   Glucose, Bld 138 (*) 70 - 99 (mg/dL)   BUN 17  6 - 23 (mg/dL)   Creatinine, Ser 7.25  0.50 - 1.10 (mg/dL)   Calcium 36.6 (*) 8.4 - 10.5 (mg/dL)   Total Protein 9.6 (*) 6.0 - 8.3 (g/dL)   Albumin 5.0  3.5 - 5.2 (g/dL)   AST 26  0 - 37 (U/L)   ALT 20  0 - 35 (U/L)   Alkaline Phosphatase 84  39 - 117 (U/L)   Total Bilirubin 0.4  0.3 - 1.2 (mg/dL)   GFR calc non Af Amer >90  >90 (mL/min)   GFR calc Af Amer >90  >90 (mL/min)  URINALYSIS, ROUTINE W REFLEX MICROSCOPIC      Component Value Range   Color, Urine YELLOW  YELLOW    APPearance CLOUDY (*) CLEAR    Specific Gravity, Urine >1.030 (*) 1.005 - 1.030    pH 6.0  5.0 - 8.0    Glucose, UA NEGATIVE  NEGATIVE (mg/dL)   Hgb urine dipstick TRACE (*) NEGATIVE    Bilirubin Urine MODERATE (*) NEGATIVE    Ketones, ur >80 (*) NEGATIVE (mg/dL)   Protein, ur 30 (*) NEGATIVE (mg/dL)   Urobilinogen, UA 0.2  0.0 - 1.0 (mg/dL)   Nitrite NEGATIVE  NEGATIVE    Leukocytes, UA NEGATIVE  NEGATIVE   URINE MICROSCOPIC-ADD ON      Component Value Range   Squamous Epithelial / LPF FEW (*) RARE    WBC, UA 7-10  <3 (WBC/hpf)   RBC / HPF 7-10  <3 (RBC/hpf)   Bacteria, UA MANY (*) RARE    Ct Abdomen Pelvis W Contrast  05/16/2011  *RADIOLOGY REPORT*  Clinical Data: Recurrent left-sided abdominal pain.  CT ABDOMEN AND PELVIS WITH CONTRAST  Technique:  Multidetector CT imaging of the abdomen and pelvis was performed following the standard protocol during bolus administration of intravenous contrast.  Contrast: OMNIPAQUE IOHEXOL 300 MG/ML IV SOLN  Comparison: CT scan  02/27/2011.  Findings: The lung bases are clear.  No  pleural effusion.  The liver is unremarkable.  No focal lesions or intrahepatic biliary dilatation.  The gallbladder is surgically absent.  Mild common bile duct prominence is stable.  The pancreas is normal. The spleen is normal in size.  No focal lesions.  The adrenal glands and kidneys are normal and stable.  The stomach, duodenum, small bowel and colon demonstrate no significant abnormalities.  No inflammatory changes or mass lesions.  There are stable upper abdominal lymph nodes but no adenopathy.  The aorta is normal in caliber.  Moderate distal atherosclerotic changes, particularly for the patient's age.  The uterus is surgically absent.  Both ovaries are still present and appear normal.  The bladder is normal.  No pelvic mass, adenopathy or free pelvic fluid collections.  No inguinal mass or hernia.  The bony pelvis is intact.  IMPRESSION: No acute abdominal/pelvic findings, mass lesions or adenopathy.  Original Report Authenticated By: P. Loralie Champagne, M.D.   3:25PM- EDP at bedside discusses treatment plan. 6:48PM- Recheck. EDP at bedside discusses treatment plan. 7:04PM- Recheck. EDP at bedside discusses treatment plan.      MDM  Patient with left sided abdominal pain that she has had for over a week. She had a CT done which showed a possible complicated cystic lesion to the left ovary. She had been treated with analgesics and has some relief of the pain, now wit recurrence. Given IV, analgesics and antiemetic with improvement. Repeat CT with no comment on previous LLQ ovarian findings. Spoke with radiologist, Dr. Renato Gails who reviewed both CTs and advised previous CT was most likely an ovarian cyst which has resolved.Patientand her husband informed of clinical course, understand medical decision-making process, and agree with plan.Pt feels improved after observation and/or treatment in ED.Pt stable in ED with no significant deterioration in condition.The patient appears reasonably screened and/or  stabilized for discharge and I doubt any other medical condition or other [facility] requiring further screening, evaluation, or treatment in the ED at this time prior to discharge.  I personally performed the services described in this documentation, which was scribed in my presence. The recorded information has been reviewed and considered.   MDM Reviewed: nursing note, vitals and previous chart Reviewed previous: CT scan Interpretation: labs and CT scan Consults: radiology.      Nicoletta Dress. Colon Branch, MD 05/18/11 7829

## 2011-05-16 NOTE — ED Notes (Signed)
Pt states LLQ pain. Pt states "they found a spot to the same area last time I was here" Pt states she began having pain again a week ago. Nausea and Vomiting also.

## 2011-05-17 MED FILL — Oxycodone w/ Acetaminophen Tab 5-325 MG: ORAL | Qty: 6 | Status: AC

## 2011-09-07 ENCOUNTER — Emergency Department (HOSPITAL_COMMUNITY): Payer: Medicaid Other

## 2011-09-07 ENCOUNTER — Emergency Department (HOSPITAL_COMMUNITY)
Admission: EM | Admit: 2011-09-07 | Discharge: 2011-09-07 | Disposition: A | Discharge status: Home / Self Care | Payer: Medicaid Other | Attending: Emergency Medicine | Admitting: Emergency Medicine

## 2011-09-07 ENCOUNTER — Other Ambulatory Visit: Payer: Self-pay

## 2011-09-07 ENCOUNTER — Encounter (HOSPITAL_COMMUNITY): Payer: Self-pay

## 2011-09-07 DIAGNOSIS — R079 Chest pain, unspecified: Secondary | ICD-10-CM | POA: Insufficient documentation

## 2011-09-07 DIAGNOSIS — M329 Systemic lupus erythematosus, unspecified: Secondary | ICD-10-CM | POA: Insufficient documentation

## 2011-09-07 DIAGNOSIS — R112 Nausea with vomiting, unspecified: Secondary | ICD-10-CM | POA: Insufficient documentation

## 2011-09-07 DIAGNOSIS — R1013 Epigastric pain: Secondary | ICD-10-CM | POA: Insufficient documentation

## 2011-09-07 DIAGNOSIS — R0602 Shortness of breath: Secondary | ICD-10-CM | POA: Insufficient documentation

## 2011-09-07 LAB — CBC
HCT: 44.1 % (ref 36.0–46.0)
Hemoglobin: 14.6 g/dL (ref 12.0–15.0)
MCH: 30.2 pg (ref 26.0–34.0)
MCHC: 33.1 g/dL (ref 30.0–36.0)
MCV: 91.1 fL (ref 78.0–100.0)
Platelets: 275 10*3/uL (ref 150–400)
RBC: 4.84 MIL/uL (ref 3.87–5.11)
RDW: 12.6 % (ref 11.5–15.5)
WBC: 6.8 10*3/uL (ref 4.0–10.5)

## 2011-09-07 LAB — COMPREHENSIVE METABOLIC PANEL
ALT: 11 U/L (ref 0–35)
AST: 13 U/L (ref 0–37)
Albumin: 4.4 g/dL (ref 3.5–5.2)
Alkaline Phosphatase: 76 U/L (ref 39–117)
BUN: 13 mg/dL (ref 6–23)
CO2: 26 mEq/L (ref 19–32)
Calcium: 10.2 mg/dL (ref 8.4–10.5)
Chloride: 101 mEq/L (ref 96–112)
Creatinine, Ser: 0.53 mg/dL (ref 0.50–1.10)
GFR calc Af Amer: >90 mL/min (ref >90)
GFR calc non Af Amer: >90 mL/min (ref >90)
Glucose, Bld: 111 mg/dL — ABNORMAL HIGH (ref 70–99)
Potassium: 4.2 mEq/L (ref 3.5–5.1)
Sodium: 139 mEq/L (ref 135–145)
Total Bilirubin: 0.4 mg/dL (ref 0.3–1.2)
Total Protein: 8.4 g/dL — ABNORMAL HIGH (ref 6.0–8.3)

## 2011-09-07 LAB — TROPONIN I: Troponin I: <0.3 ng/mL (ref <0.30)

## 2011-09-07 LAB — LIPASE, BLOOD: Lipase: 14 U/L (ref 11–59)

## 2011-09-07 MED ORDER — ONDANSETRON HCL 4 MG/2ML IJ SOLN
4.0000 mg | Freq: Once | INTRAMUSCULAR | Status: AC
  Administered 2011-09-07: 4 mg via INTRAVENOUS
  Filled 2011-09-07: qty 2

## 2011-09-07 MED ORDER — MORPHINE SULFATE 4 MG/ML IJ SOLN
6.0000 mg | Freq: Once | INTRAMUSCULAR | Status: AC
  Administered 2011-09-07: 6 mg via INTRAVENOUS
  Filled 2011-09-07: qty 2

## 2011-09-07 MED ORDER — HYDROMORPHONE HCL PF 1 MG/ML IJ SOLN
1.0000 mg | Freq: Once | INTRAMUSCULAR | Status: AC
  Administered 2011-09-07: 1 mg via INTRAVENOUS
  Filled 2011-09-07: qty 1

## 2011-09-07 MED ORDER — ONDANSETRON HCL 4 MG PO TABS: 4.0000 mg | ORAL_TABLET | Freq: Four times a day (QID) | ORAL | Status: AC

## 2011-09-07 MED ORDER — GI COCKTAIL ~~LOC~~
10.0000 mL | Freq: Once | ORAL | Status: AC
  Administered 2011-09-07: 10 mL via ORAL
  Filled 2011-09-07: qty 30

## 2011-09-07 NOTE — ED Provider Notes (Signed)
History    46 year old female with upper abdominal and lower sternal pain. Onset yesterday. Gradual onset. Does not radiate. Patient has been having preceding nausea and vomiting since about Saturday. Multiple episodes. Nonbilious and nonbloody. No diarrhea. No fevers or chills. No sick contacts. Patient is a smoker and occasionally for short of breath, but no significant change from her baseline. Urinary complaints. No unusual vaginal bleeding or discharge. CSN: [id number]  Arrival date & time 09/07/11  1226   First MD Initiated Contact with Patient 09/07/11 1256      Chief Complaint  Patient presents with  . Chest Pain    (Consider location/radiation/quality/duration/timing/severity/associated sxs/prior treatment) HPI  Past Medical History  Diagnosis Date  . Colitis   . Lupus   . DDD (degenerative disc disease)     Past Surgical History  Procedure Date  . Abdominal hysterectomy   . Appendectomy   . Cholecystectomy   . Cesarean section   . Abdominal surgery     No family history on file.  History  Substance Use Topics  . Smoking status: Current Everyday Smoker  . Smokeless tobacco: Not on file  . Alcohol Use: No    OB History    Grav Para Term Preterm Abortions TAB SAB Ect Mult Living                  Review of Systems   Review of symptoms negative unless otherwise noted in HPI.   Allergies  Honey bee venom; Ibuprofen; Ketorolac tromethamine; and Penicillins  Home Medications   Current Outpatient Rx  Name Route Sig Dispense Refill  . ALBUTEROL SULFATE HFA 108 (90 BASE) MCG/ACT IN AERS Inhalation Inhale 2 puffs into the lungs every 6 (six) hours as needed. For shortness of breath       BP 97/64  Pulse 107  Temp(Src) 98 F (36.7 C) (Oral)  Resp 20  Ht 5\' 8"  (1.727 m)  Wt 155 lb (70.308 kg)  BMI 23.57 kg/m2  SpO2 99%  Physical Exam  Nursing note and vitals reviewed. Constitutional: She appears well-developed and well-nourished. No distress.      In bed. No acute distress.  HENT:  Head: Normocephalic and atraumatic.  Eyes: Conjunctivae are normal. Right eye exhibits no discharge. Left eye exhibits no discharge.  Neck: Neck supple.  Cardiovascular: Normal rate, regular rhythm and normal heart sounds.  Exam reveals no gallop and no friction rub.   No murmur heard. Pulmonary/Chest: Effort normal and breath sounds normal. No respiratory distress. She exhibits no tenderness.  Abdominal: Soft. She exhibits no distension. There is tenderness.       Mild tenderness in the epigastric region without rebound or guarding. No distention. No mass palpated.  Genitourinary:       CVA tenderness.  Musculoskeletal: She exhibits no edema and no tenderness.  Neurological: She is alert.  Skin: Skin is warm and dry. She is not diaphoretic.  Psychiatric: She has a normal mood and affect. Her behavior is normal. Thought content normal.    ED Course  Procedures (including critical care time)  Labs Reviewed  COMPREHENSIVE METABOLIC PANEL - Abnormal; Notable for the following:    Glucose, Bld 111 (*)    Total Protein 8.4 (*)    All other components within normal limits  TROPONIN I  LIPASE, BLOOD  CBC  LAB REPORT - SCANNED   Dg Chest 2 View  09/07/2011  *RADIOLOGY REPORT*  Clinical Data: Chest pain  CHEST - 2 VIEW  Comparison: None.  Findings: Lungs are clear. No pleural effusion or pneumothorax.  Cardiomediastinal silhouette is within normal limits.  Mild degenerative changes of the visualized thoracolumbar spine.  IMPRESSION: Normal chest radiographs.  Original Report Authenticated By: Charline Bills, M.D.    EKG:  Rhythm: Sinus tachycardia Rate: 103 Axis: normal Intervals: Left atrial enlargement ST segments: Nonspecific ST changes. There is T-wave flattening inferiorly and laterally.   1. Abdominal pain   2. Nausea and vomiting       MDM  Procedure female with abdominal pain. Epigastric. Consider cardiac etiology but doubt.  Troponin is normal and EKG not suggestive. Suspect related to abdominal strain from recent vomiting. Mild tenderness on exam. Doubt surgical abdomen.        Raeford Razor, MD 09/09/11 1406

## 2011-09-07 NOTE — ED Notes (Signed)
Pt reports having n/v since Saturday, cp -mid sternal since yesterday.  bil arm numbness since yesterday.  +smoker.  Sob at times.

## 2011-09-07 NOTE — ED Notes (Signed)
Patient transported to X-ray. NAD noted. Pt reports her pain has not eased.

## 2011-09-07 NOTE — Discharge Instructions (Signed)
Abdominal Pain Abdominal pain can be caused by many things. Your caregiver decides the seriousness of your pain by an examination and possibly blood tests and X-rays. Many cases can be observed and treated at home. Most abdominal pain is not caused by a disease and will probably improve without treatment. However, in many cases, more time must pass before a clear cause of the pain can be found. Before that point, it may not be known if you need more testing, or if hospitalization or surgery is needed. HOME CARE INSTRUCTIONS   Do not take laxatives unless directed by your caregiver.   Take pain medicine only as directed by your caregiver.   Only take over-the-counter or prescription medicines for pain, discomfort, or fever as directed by your caregiver.   Try a clear liquid diet (broth, tea, or water) for as long as directed by your caregiver. Slowly move to a bland diet as tolerated.  SEEK IMMEDIATE MEDICAL CARE IF:   The pain does not go away.   You have a fever.   You keep throwing up (vomiting).   The pain is felt only in portions of the abdomen. Pain in the right side could possibly be appendicitis. In an adult, pain in the left lower portion of the abdomen could be colitis or diverticulitis.   You pass bloody or black tarry stools.  MAKE SURE YOU:   Understand these instructions.   Will watch your condition.   Will get help right away if you are not doing well or get worse.  Document Released: 02/03/2005 Document Revised: 04/15/2011 Document Reviewed: 12/13/2007 [employer] Patient Information 2012 [employer], [place] and Vomiting Nausea means you feel sick to your stomach. Throwing up (vomiting) is a reflex where stomach contents come out of your mouth. HOME CARE   Take medicine as told by your doctor.   Do not force yourself to eat. However, you do need to drink fluids.   If you feel like eating, eat a normal diet as told by your doctor.   Eat rice, wheat, potatoes,  bread, lean meats, yogurt, fruits, and vegetables.   Avoid high-fat foods.   Drink enough fluids to keep your pee (urine) clear or pale yellow.   Ask your doctor how to replace body fluid losses (rehydrate). Signs of body fluid loss (dehydration) include:   Feeling very thirsty.   Dry lips and mouth.   Feeling dizzy.   Dark pee.   Peeing less than normal.   Feeling confused.   Fast breathing or heart rate.  GET HELP RIGHT AWAY IF:   You have blood in your throw up.   You have black or bloody poop (stool).   You have a bad headache or stiff neck.   You feel confused.   You have bad belly (abdominal) pain.   You have chest pain or trouble breathing.   You do not pee at least once every 8 hours.   You have cold, clammy skin.   You keep throwing up after 24 to 48 hours.   You have a fever.  MAKE SURE YOU:   Understand these instructions.   Will watch your condition.   Will get help right away if you are not doing well or get worse.  Document Released: 10/13/2007 Document Revised: 04/15/2011 Document Reviewed: 09/25/2010 [facility] Patient Information 2012 [place], [place].Nausea and Vomiting Nausea means you feel sick to your stomach. Throwing up (vomiting) is a reflex where stomach contents come out of your mouth. HOME CARE  Take medicine as told by your doctor.   Do not force yourself to eat. However, you do need to drink fluids.   If you feel like eating, eat a normal diet as told by your doctor.   Eat rice, wheat, potatoes, bread, lean meats, yogurt, fruits, and vegetables.   Avoid high-fat foods.   Drink enough fluids to keep your pee (urine) clear or pale yellow.   Ask your doctor how to replace body fluid losses (rehydrate). Signs of body fluid loss (dehydration) include:   Feeling very thirsty.   Dry lips and mouth.   Feeling dizzy.   Dark pee.   Peeing less than normal.   Feeling confused.   Fast breathing or heart rate.  GET HELP  RIGHT AWAY IF:   You have blood in your throw up.   You have black or bloody poop (stool).   You have a bad headache or stiff neck.   You feel confused.   You have bad belly (abdominal) pain.   You have chest pain or trouble breathing.   You do not pee at least once every 8 hours.   You have cold, clammy skin.   You keep throwing up after 24 to 48 hours.   You have a fever.  MAKE SURE YOU:   Understand these instructions.   Will watch your condition.   Will get help right away if you are not doing well or get worse.  Document Released: 10/13/2007 Document Revised: 04/15/2011 Document Reviewed: 09/25/2010 [facility] Patient Information 2012 [place], [place].[name]

## 2011-09-07 NOTE — ED Notes (Signed)
Patient transported to X-ray 

## 2011-09-17 ENCOUNTER — Encounter (HOSPITAL_COMMUNITY): Payer: Self-pay | Admitting: *Deleted

## 2011-09-17 ENCOUNTER — Emergency Department (HOSPITAL_COMMUNITY)
Admission: EM | Admit: 2011-09-17 | Discharge: 2011-09-17 | Disposition: A | Discharge status: Home / Self Care | Payer: Medicaid Other | Attending: Emergency Medicine | Admitting: Emergency Medicine

## 2011-09-17 ENCOUNTER — Emergency Department (HOSPITAL_COMMUNITY): Payer: Medicaid Other

## 2011-09-17 DIAGNOSIS — R079 Chest pain, unspecified: Secondary | ICD-10-CM | POA: Insufficient documentation

## 2011-09-17 DIAGNOSIS — M329 Systemic lupus erythematosus, unspecified: Secondary | ICD-10-CM | POA: Insufficient documentation

## 2011-09-17 DIAGNOSIS — Z79899 Other long term (current) drug therapy: Secondary | ICD-10-CM | POA: Insufficient documentation

## 2011-09-17 DIAGNOSIS — R5381 Other malaise: Secondary | ICD-10-CM | POA: Insufficient documentation

## 2011-09-17 DIAGNOSIS — R1013 Epigastric pain: Secondary | ICD-10-CM | POA: Insufficient documentation

## 2011-09-17 DIAGNOSIS — R112 Nausea with vomiting, unspecified: Secondary | ICD-10-CM | POA: Insufficient documentation

## 2011-09-17 DIAGNOSIS — R000 Tachycardia, unspecified: Secondary | ICD-10-CM | POA: Insufficient documentation

## 2011-09-17 DIAGNOSIS — K861 Other chronic pancreatitis: Secondary | ICD-10-CM | POA: Insufficient documentation

## 2011-09-17 LAB — DIFFERENTIAL
Basophils Absolute: 0 10*3/uL (ref 0.0–0.1)
Eosinophils Relative: 0 % (ref 0–5)
Lymphocytes Relative: 18 % (ref 12–46)
Monocytes Absolute: 0.2 10*3/uL (ref 0.1–1.0)
Monocytes Relative: 2 % — ABNORMAL LOW (ref 3–12)
Neutro Abs: 6.4 10*3/uL (ref 1.7–7.7)

## 2011-09-17 LAB — COMPREHENSIVE METABOLIC PANEL
Albumin: 4.8 g/dL (ref 3.5–5.2)
Alkaline Phosphatase: 78 U/L (ref 39–117)
BUN: 14 mg/dL (ref 6–23)
Chloride: 98 mEq/L (ref 96–112)
Creatinine, Ser: 0.56 mg/dL (ref 0.50–1.10)
GFR calc Af Amer: >90 mL/min (ref >90)
GFR calc non Af Amer: >90 mL/min (ref >90)
Glucose, Bld: 134 mg/dL — ABNORMAL HIGH (ref 70–99)
Potassium: 3.8 mEq/L (ref 3.5–5.1)
Total Bilirubin: 0.3 mg/dL (ref 0.3–1.2)

## 2011-09-17 LAB — CBC
HCT: 44.7 % (ref 36.0–46.0)
Hemoglobin: 15.1 g/dL — ABNORMAL HIGH (ref 12.0–15.0)
MCHC: 33.8 g/dL (ref 30.0–36.0)
MCV: 90.1 fL (ref 78.0–100.0)
RDW: 12.7 % (ref 11.5–15.5)
WBC: 8.1 10*3/uL (ref 4.0–10.5)

## 2011-09-17 LAB — URINALYSIS, ROUTINE W REFLEX MICROSCOPIC
Glucose, UA: NEGATIVE mg/dL
Ketones, ur: NEGATIVE mg/dL
Leukocytes, UA: NEGATIVE
Nitrite: NEGATIVE
Protein, ur: 30 mg/dL — AB
Urobilinogen, UA: 0.2 mg/dL (ref 0.0–1.0)

## 2011-09-17 LAB — URINE MICROSCOPIC-ADD ON

## 2011-09-17 LAB — TROPONIN I: Troponin I: <0.3 ng/mL (ref <0.30)

## 2011-09-17 MED ORDER — ONDANSETRON HCL 4 MG/2ML IJ SOLN
INTRAMUSCULAR | Status: AC
  Filled 2011-09-17: qty 2

## 2011-09-17 MED ORDER — METOCLOPRAMIDE HCL 5 MG/ML IJ SOLN
10.0000 mg | Freq: Once | INTRAMUSCULAR | Status: AC
  Administered 2011-09-17: 10 mg via INTRAVENOUS
  Filled 2011-09-17: qty 2

## 2011-09-17 MED ORDER — ONDANSETRON HCL 4 MG/2ML IJ SOLN: 4.0000 mg | Freq: Once | INTRAMUSCULAR | Status: DC

## 2011-09-17 MED ORDER — OXYCODONE-ACETAMINOPHEN 5-325 MG PO TABS: 1.0000 | ORAL_TABLET | Freq: Four times a day (QID) | ORAL | Status: DC | PRN

## 2011-09-17 MED ORDER — ONDANSETRON HCL 4 MG/2ML IJ SOLN
8.0000 mg | Freq: Once | INTRAMUSCULAR | Status: AC
  Administered 2011-09-17: 8 mg via INTRAVENOUS

## 2011-09-17 MED ORDER — SODIUM CHLORIDE 0.9 % IV SOLN
INTRAVENOUS | Status: DC
  Administered 2011-09-17: 14:00:00 via INTRAVENOUS

## 2011-09-17 MED ORDER — SODIUM CHLORIDE 0.9 % IV BOLUS (SEPSIS)
1000.0000 mL | Freq: Once | INTRAVENOUS | Status: AC
  Administered 2011-09-17: 1000 mL via INTRAVENOUS

## 2011-09-17 MED ORDER — HYDROMORPHONE HCL PF 1 MG/ML IJ SOLN
1.0000 mg | Freq: Once | INTRAMUSCULAR | Status: AC
  Administered 2011-09-17: 1 mg via INTRAVENOUS
  Filled 2011-09-17: qty 1

## 2011-09-17 MED ORDER — PROMETHAZINE HCL 25 MG PO TABS: 25.0000 mg | ORAL_TABLET | Freq: Four times a day (QID) | ORAL | Status: DC | PRN

## 2011-09-17 MED ORDER — OXYCODONE-ACETAMINOPHEN 5-325 MG PO TABS
2.0000 | ORAL_TABLET | Freq: Once | ORAL | Status: AC
  Administered 2011-09-17: 2 via ORAL
  Filled 2011-09-17: qty 2

## 2011-09-17 NOTE — ED Notes (Signed)
Pt c/o epigastric pain that started last pm, pain is associated with n/v, multiple episodes, pt actively vomiting in exam room, pt has hx of pancreatitis,

## 2011-09-17 NOTE — ED Notes (Signed)
Pt to radiology dept. Medicated for Pain and nausea prior to transport.

## 2011-09-17 NOTE — ED Notes (Signed)
MD at bedside. 

## 2011-09-17 NOTE — Discharge Instructions (Signed)
Acute Pancreatitis The pancreas is a large gland located behind your stomach. It produces (secretes) enzymes. These enzymes help digest food. It also releases the hormones glucagon and insulin. These hormones help regulate blood sugar. When the pancreas becomes inflamed, the disease is called pancreatitis. Inflammation of the pancreas occurs when enzymes from the pancreas begin attacking and digesting the pancreas. CAUSES  Most cases ofsudden onset (acute) pancreatitis are caused by:  Alcohol abuse.   Gallstones.  Other less common causes are:  Some medications.   Exposure to certain chemicals   Infection.   Damage caused by an accident (trauma).   Surgery of the belly (abdomen).  SYMPTOMS  Acute pancreatitis usually begins with pain in the upper abdomen and may radiate to the back. This pain may last a couple days. The constant pain varies from mild to severe. The acute form of this disease may vary from mild, nonspecific abdominal pain to profound shock with coma. About 1 in 5 cases are severe. These patients become dehydrated and develop low blood pressure. In severe cases, bleeding into the pancreas can lead to shock and death. The lungs, heart, and kidneys may fail. DIAGNOSIS  Your caregiver will form a clinical opinion after giving you an exam. Laboratory work is used to confirm this diagnosis. Often,a digestive enzyme from the pancreas (serum amylase) and other enzymes are elevated. Sugars and fats (lipids) in the blood may be elevated. There may also be changes in the following levels: calcium, magnesium, potassium, chloride and bicarbonate (chemicals in the blood). X-rays, a CT scan, or ultrasound of your abdomen may be necessary to search for other causes of your abdominal pain. TREATMENT  Most pancreatitis requires treatment of symptoms. Most acute attacks last a couple of days. Your caregiver can discuss the treatment options with you.  If complications occur, hospitalization  may be necessary for pain control and intravenous (IV) fluid replacement.   Sometimes, a tube may be put into the stomach to control vomiting.   Food may not be allowed for 3 to 4 days. This gives the pancreas time to rest. Giving the pancreas a rest means there is no stimulation that would produce more enzymes and cause more damage.   Medicines (antibiotics) that kill germs may be given if infection is the cause.   Sometimes, surgery may be required.   Following an acute attack, your caregiver will determine the cause, if possible, and offer suggestions to prevent recurrences.  HOME CARE INSTRUCTIONS   Eat smaller, more frequent meals. This reduces the amount of digestive juices the pancreas produces.   Decrease the amount of fat in your diet. This may help reduce loose, diarrheal stools.   Drink enough water and fluids to keep your urine clear or pale yellow. This is to avoid dehydration which can cause increased pain.   Talk to your caregiver about pain relievers or other medicines that may help.   Avoid anything that may have triggered your pancreatitis (for example, alcohol).   Follow the diet advised by your caregiver. Do not advance the diet too soon.   Take medicines as prescribed.   Get plenty of rest.   Check your blood sugar at home as directed by your caregiver.   If your caregiver has given you a follow-up appointment, it is very important to keep that appointment. Not keeping the appointment could result in a lasting (chronic) or permanent injury, pain, and disability. If there is any problem keeping the appointment, you must call to reschedule.    SEEK MEDICAL CARE IF:   You are not recovering in the time described by your caregiver.   You have persistent pain, weakness, or feel sick to your stomach (nauseous).   You have recovered and then have another bout of pain.  SEEK IMMEDIATE MEDICAL CARE IF:   You are unable to eat or keep fluids down.   Your pain  increases a lot or changes.   You have an oral temperature above 102 F (38.9 C), not controlled by medicine.   Your skin or the white part of your eyes look yellow (jaundice).   You develop vomiting.   You feel dizzy or faint.   Your blood sugar is high (over 300).  MAKE SURE YOU:   Understand these instructions.   Will watch your condition.   Will get help right away if you are not doing well or get worse.  Document Released: 04/26/2005 Document Revised: 04/15/2011 Document Reviewed: 12/08/2007 [employer] Patient Information 2012 [employer]. 

## 2011-09-17 NOTE — ED Notes (Signed)
Chest pain, nausea, vomiting since last night.

## 2011-09-17 NOTE — ED Provider Notes (Signed)
This chart was scribed for Dayton Bailiff, MD by Wallis Mart. The patient was seen in room APA02/APA02 and the patient's care was started at 12:19 PM.   CSN: [id number]  Arrival date & time 09/17/11  1139   First MD Initiated Contact with Patient 09/17/11 1155      Chief Complaint  Patient presents with  . Chest Pain    (Consider location/radiation/quality/duration/timing/severity/associated sxs/prior treatment) HPI  Andrea Nelson is a 46 y.o. female who presents to the Emergency Department complaining of sudden onset, persistence of constant, gradually worsening, moderate to severe epigastric pain onset last night. Pt c/o associated nausea and vomiting.  Pt w/ h/o colitis. Denies blood in urine or stool.  There are no other associated symptoms and no other alleviating or aggravating factors.   Past Medical History  Diagnosis Date  . Colitis   . Lupus   . DDD (degenerative disc disease)     Past Surgical History  Procedure Date  . Abdominal hysterectomy   . Appendectomy   . Cholecystectomy   . Cesarean section   . Abdominal surgery     No family history on file.  History  Substance Use Topics  . Smoking status: Current Everyday Smoker  . Smokeless tobacco: Not on file  . Alcohol Use: No    OB History    Grav Para Term Preterm Abortions TAB SAB Ect Mult Living                  Review of Systems  Constitutional: Positive for fatigue. Negative for fever, chills, activity change and appetite change.  HENT: Negative for congestion, sore throat, rhinorrhea, neck pain and neck stiffness.   Respiratory: Negative for cough and shortness of breath.   Cardiovascular: Negative for chest pain and palpitations.  Gastrointestinal: Positive for nausea, vomiting and abdominal pain. Negative for diarrhea, constipation and blood in stool.  Genitourinary: Negative for dysuria, urgency, frequency and flank pain.  Musculoskeletal: Negative for myalgias, back pain and  arthralgias.  Neurological: Negative for dizziness, weakness, numbness and headaches.  All other systems reviewed and are negative.    10 Systems reviewed and all are negative for acute change except as noted in the HPI.   Allergies  Honey bee venom; Ibuprofen; Ketorolac tromethamine; and Penicillins  Home Medications   Current Outpatient Rx  Name Route Sig Dispense Refill  . ALBUTEROL SULFATE HFA 108 (90 BASE) MCG/ACT IN AERS Inhalation Inhale 2 puffs into the lungs every 6 (six) hours as needed. For shortness of breath     . CALCIUM CARBONATE ANTACID 500 MG PO CHEW Oral Chew 2 tablets by mouth daily as needed. indigestion    . OXYCODONE-ACETAMINOPHEN 5-325 MG PO TABS Oral Take 1-2 tablets by mouth every 6 (six) hours as needed for pain. 20 tablet 0  . PROMETHAZINE HCL 25 MG PO TABS Oral Take 0.5 tablets (12.5 mg total) by mouth every 6 (six) hours as needed for nausea. 10 tablet 0  . PROMETHAZINE HCL 25 MG PO TABS Oral Take 1 tablet (25 mg total) by mouth every 6 (six) hours as needed for nausea. 20 tablet 0    BP 142/70  Pulse 83  Temp(Src) 98.1 F (36.7 C) (Oral)  Resp 18  Ht 5\' 8"  (1.727 m)  Wt 150 lb (68.04 kg)  BMI 22.81 kg/m2  SpO2 96%  Physical Exam  Nursing note and vitals reviewed. Constitutional: She is oriented to person, place, and time. She appears well-developed and well-nourished.  Uncomfortable in appearance   HENT:  Head: Normocephalic and atraumatic.  Mouth/Throat: Oropharynx is clear and moist.  Eyes: Conjunctivae and EOM are normal. Pupils are equal, round, and reactive to light.  Neck: Normal range of motion. Neck supple. No tracheal deviation present.  Cardiovascular: Regular rhythm, normal heart sounds and intact distal pulses.  Tachycardia present.  Exam reveals no gallop and no friction rub.   No murmur heard. Pulmonary/Chest: Effort normal and breath sounds normal. No respiratory distress.  Abdominal: Soft. She exhibits no distension.  There is tenderness. There is no rebound and no guarding.       Moderate to severe epigastric pain  Musculoskeletal: Normal range of motion. She exhibits no edema.  Neurological: She is alert and oriented to person, place, and time. No cranial nerve deficit or sensory deficit.  Skin: Skin is warm and dry.  Psychiatric: She has a normal mood and affect. Her behavior is normal.    ED Course  Procedures (including critical care time)   Date: 09/17/2011  Rate: 86  Rhythm: normal sinus rhythm  QRS Axis: right  Intervals: normal  ST/T Wave abnormalities: normal  Conduction Disutrbances:none  Narrative Interpretation:   Old EKG Reviewed: unchanged  DIAGNOSTIC STUDIES: Oxygen Saturation is 100% on room air, normal by my interpretation.    COORDINATION OF CARE:    Labs Reviewed  CBC - Abnormal; Notable for the following:    Hemoglobin 15.1 (*)    All other components within normal limits  DIFFERENTIAL - Abnormal; Notable for the following:    Neutrophils Relative 79 (*)    Monocytes Relative 2 (*)    All other components within normal limits  COMPREHENSIVE METABOLIC PANEL - Abnormal; Notable for the following:    Glucose, Bld 134 (*)    Calcium 11.8 (*)    Total Protein 8.5 (*)    All other components within normal limits  URINALYSIS, ROUTINE W REFLEX MICROSCOPIC - Abnormal; Notable for the following:    Protein, ur 30 (*)    All other components within normal limits  URINE MICROSCOPIC-ADD ON - Abnormal; Notable for the following:    Squamous Epithelial / LPF MANY (*)    All other components within normal limits  LIPASE, BLOOD  TROPONIN I   Dg Abd Acute W/chest  09/17/2011  *RADIOLOGY REPORT*  Clinical Data: Upper abdominal pain  ACUTE ABDOMEN SERIES (ABDOMEN 2 VIEW & CHEST 1 VIEW)  Comparison: 09/07/2011 chest x-ray  Findings: Normal heart.  Clear lungs  No disproportionate dilatation of bowel.  No free intraperitoneal gas.  Postoperative changes in the right side of the  abdomen.  IMPRESSION: Nonobstructive bowel gas pattern.  Original Report Authenticated By: Donavan Burnet, M.D.     1. Chronic pancreatitis       MDM  Acute on chronic pancreatitis despite a normal lipase. Remainder of her laboratory studies are relatively unremarkable. She received IV fluids, antiemetics, pain medication. She is able to tolerate oral intake one emergency department. She'll be discharged home with pain medication and antiemetics. Additional etiologies of her pain were considered including gastritis and ACS. She had normal EKG as well as a negative troponin. Her symptoms have been present for greater than 6 hours therefore feel this adequately rules out ACS. She'll be discharged home with instructions followup with her primary care physician.  Strict return precautions provided  I personally performed the services described in this documentation, which was scribed in my presence. The recorded information has been reviewed and  considered.        Dayton Bailiff, MD 09/17/11 [phone]

## 2011-09-19 ENCOUNTER — Emergency Department (HOSPITAL_COMMUNITY): Payer: Medicaid Other

## 2011-09-19 ENCOUNTER — Encounter (HOSPITAL_COMMUNITY): Payer: Self-pay | Admitting: *Deleted

## 2011-09-19 ENCOUNTER — Inpatient Hospital Stay (HOSPITAL_COMMUNITY)
Admission: EM | Admit: 2011-09-19 | Discharge: 2011-09-25 | DRG: 392 | Disposition: A | Discharge status: Home / Self Care | Payer: Medicaid Other | Attending: Internal Medicine | Admitting: Internal Medicine

## 2011-09-19 DIAGNOSIS — K5900 Constipation, unspecified: Secondary | ICD-10-CM | POA: Diagnosis not present

## 2011-09-19 DIAGNOSIS — R1013 Epigastric pain: Secondary | ICD-10-CM | POA: Diagnosis present

## 2011-09-19 DIAGNOSIS — K208 Other esophagitis without bleeding: Secondary | ICD-10-CM | POA: Diagnosis present

## 2011-09-19 DIAGNOSIS — F17200 Nicotine dependence, unspecified, uncomplicated: Secondary | ICD-10-CM | POA: Diagnosis present

## 2011-09-19 DIAGNOSIS — M329 Systemic lupus erythematosus, unspecified: Secondary | ICD-10-CM

## 2011-09-19 DIAGNOSIS — R6889 Other general symptoms and signs: Secondary | ICD-10-CM | POA: Diagnosis present

## 2011-09-19 DIAGNOSIS — K2289 Other specified disease of esophagus: Principal | ICD-10-CM | POA: Diagnosis present

## 2011-09-19 DIAGNOSIS — K219 Gastro-esophageal reflux disease without esophagitis: Secondary | ICD-10-CM | POA: Diagnosis present

## 2011-09-19 DIAGNOSIS — R1115 Cyclical vomiting syndrome unrelated to migraine: Secondary | ICD-10-CM | POA: Diagnosis present

## 2011-09-19 DIAGNOSIS — E860 Dehydration: Secondary | ICD-10-CM | POA: Diagnosis present

## 2011-09-19 DIAGNOSIS — R112 Nausea with vomiting, unspecified: Secondary | ICD-10-CM | POA: Diagnosis present

## 2011-09-19 DIAGNOSIS — K228 Other specified diseases of esophagus: Principal | ICD-10-CM | POA: Diagnosis present

## 2011-09-19 HISTORY — DX: Acute pancreatitis without necrosis or infection, unspecified: K85.90

## 2011-09-19 HISTORY — DX: Malignant neoplasm of uterus, part unspecified: C55

## 2011-09-19 HISTORY — DX: Benign neoplasm of colon, unspecified: D12.6

## 2011-09-19 HISTORY — DX: Peptic ulcer, site unspecified, unspecified as acute or chronic, without hemorrhage or perforation: K27.9

## 2011-09-19 LAB — DIFFERENTIAL
Basophils Relative: 0 % (ref 0–1)
Lymphocytes Relative: 24 % (ref 12–46)
Monocytes Relative: 4 % (ref 3–12)
Neutro Abs: 5.5 10*3/uL (ref 1.7–7.7)
Neutrophils Relative %: 69 % (ref 43–77)

## 2011-09-19 LAB — COMPREHENSIVE METABOLIC PANEL
AST: 40 U/L — ABNORMAL HIGH (ref 0–37)
Albumin: 4.8 g/dL (ref 3.5–5.2)
Alkaline Phosphatase: 71 U/L (ref 39–117)
BUN: 30 mg/dL — ABNORMAL HIGH (ref 6–23)
Chloride: 95 mEq/L — ABNORMAL LOW (ref 96–112)
Potassium: 3.6 mEq/L (ref 3.5–5.1)
Total Bilirubin: 0.7 mg/dL (ref 0.3–1.2)

## 2011-09-19 LAB — CBC
Hemoglobin: 14.8 g/dL (ref 12.0–15.0)
MCHC: 33.9 g/dL (ref 30.0–36.0)
RBC: 4.8 MIL/uL (ref 3.87–5.11)
WBC: 7.9 10*3/uL (ref 4.0–10.5)

## 2011-09-19 LAB — URINALYSIS, ROUTINE W REFLEX MICROSCOPIC
Bilirubin Urine: NEGATIVE
Glucose, UA: NEGATIVE mg/dL
Ketones, ur: NEGATIVE mg/dL
pH: 6.5 (ref 5.0–8.0)

## 2011-09-19 LAB — URINE MICROSCOPIC-ADD ON

## 2011-09-19 MED ORDER — ONDANSETRON HCL 4 MG/2ML IJ SOLN
4.0000 mg | Freq: Three times a day (TID) | INTRAMUSCULAR | Status: DC | PRN
  Administered 2011-09-19: 4 mg via INTRAVENOUS
  Filled 2011-09-19: qty 2

## 2011-09-19 MED ORDER — ONDANSETRON HCL 4 MG PO TABS: 4.0000 mg | ORAL_TABLET | Freq: Four times a day (QID) | ORAL | Status: DC | PRN

## 2011-09-19 MED ORDER — SODIUM CHLORIDE 0.9 % IV SOLN
INTRAVENOUS | Status: DC
  Administered 2011-09-19: 14:00:00 via INTRAVENOUS

## 2011-09-19 MED ORDER — SODIUM CHLORIDE 0.9 % IV BOLUS (SEPSIS)
1000.0000 mL | Freq: Once | INTRAVENOUS | Status: AC
  Administered 2011-09-19: 1000 mL via INTRAVENOUS

## 2011-09-19 MED ORDER — SODIUM CHLORIDE 0.9 % IV SOLN
INTRAVENOUS | Status: DC
  Administered 2011-09-19 – 2011-09-22 (×6): via INTRAVENOUS

## 2011-09-19 MED ORDER — ENOXAPARIN SODIUM 40 MG/0.4ML ~~LOC~~ SOLN
40.0000 mg | SUBCUTANEOUS | Status: DC
  Administered 2011-09-19 – 2011-09-24 (×6): 40 mg via SUBCUTANEOUS
  Filled 2011-09-19 (×6): qty 0.4

## 2011-09-19 MED ORDER — HYDROMORPHONE HCL PF 1 MG/ML IJ SOLN
INTRAMUSCULAR | Status: AC
  Administered 2011-09-19: 1 mg via INTRAVENOUS
  Filled 2011-09-19: qty 1

## 2011-09-19 MED ORDER — ONDANSETRON HCL 4 MG/2ML IJ SOLN
INTRAMUSCULAR | Status: AC
  Administered 2011-09-19: 4 mg via INTRAVENOUS
  Filled 2011-09-19: qty 2

## 2011-09-19 MED ORDER — HYDROMORPHONE HCL PF 1 MG/ML IJ SOLN
1.0000 mg | INTRAMUSCULAR | Status: DC | PRN
  Administered 2011-09-19 (×2): 1 mg via INTRAVENOUS
  Filled 2011-09-19 (×2): qty 1

## 2011-09-19 MED ORDER — HYDROMORPHONE HCL PF 1 MG/ML IJ SOLN
1.0000 mg | INTRAMUSCULAR | Status: DC | PRN
  Administered 2011-09-19 – 2011-09-24 (×27): 1 mg via INTRAVENOUS
  Filled 2011-09-19 (×26): qty 1

## 2011-09-19 MED ORDER — IOHEXOL 300 MG/ML  SOLN
40.0000 mL | Freq: Once | INTRAMUSCULAR | Status: AC | PRN
  Administered 2011-09-19: 40 mL via ORAL

## 2011-09-19 MED ORDER — ACETAMINOPHEN 325 MG PO TABS: 650.0000 mg | ORAL_TABLET | Freq: Four times a day (QID) | ORAL | Status: DC | PRN

## 2011-09-19 MED ORDER — ONDANSETRON HCL 4 MG/2ML IJ SOLN
4.0000 mg | Freq: Once | INTRAMUSCULAR | Status: AC
  Administered 2011-09-19: 4 mg via INTRAVENOUS
  Filled 2011-09-19: qty 2

## 2011-09-19 MED ORDER — PANTOPRAZOLE SODIUM 40 MG IV SOLR
40.0000 mg | Freq: Two times a day (BID) | INTRAVENOUS | Status: DC
  Administered 2011-09-19 – 2011-09-21 (×5): 40 mg via INTRAVENOUS
  Filled 2011-09-19 (×5): qty 40

## 2011-09-19 MED ORDER — SODIUM CHLORIDE 0.9 % IJ SOLN
INTRAMUSCULAR | Status: AC
  Administered 2011-09-19: 10 mL
  Filled 2011-09-19: qty 3

## 2011-09-19 MED ORDER — ONDANSETRON HCL 4 MG/2ML IJ SOLN
4.0000 mg | Freq: Four times a day (QID) | INTRAMUSCULAR | Status: DC | PRN
  Administered 2011-09-19 – 2011-09-20 (×2): 4 mg via INTRAVENOUS
  Filled 2011-09-19 (×2): qty 2

## 2011-09-19 MED ORDER — ACETAMINOPHEN 650 MG RE SUPP: 650.0000 mg | Freq: Four times a day (QID) | RECTAL | Status: DC | PRN

## 2011-09-19 MED ORDER — HYDROMORPHONE HCL PF 1 MG/ML IJ SOLN
1.0000 mg | Freq: Once | INTRAMUSCULAR | Status: AC
  Administered 2011-09-19: 1 mg via INTRAVENOUS
  Filled 2011-09-19: qty 1

## 2011-09-19 MED ORDER — ALBUTEROL SULFATE HFA 108 (90 BASE) MCG/ACT IN AERS: 2.0000 | INHALATION_SPRAY | Freq: Four times a day (QID) | RESPIRATORY_TRACT | Status: DC | PRN

## 2011-09-19 NOTE — ED Notes (Signed)
Floor unable to take report at this time. Pt requesting more pain med. edp aware

## 2011-09-19 NOTE — ED Provider Notes (Cosign Needed)
This chart was scribed for Andrea Lennert, MD by Andrea Nelson. The patient was seen in room APA11/APA11 and the patient's care was started at 7:23 AM.   CSN: [id number]  Arrival date & time 09/19/11  0865   First MD Initiated Contact with Patient 09/19/11 0715      Chief Complaint  Patient presents with  . Chest Pain    (Consider location/radiation/quality/duration/timing/severity/associated sxs/prior treatment) Patient is a 46 y.o. female presenting with abdominal pain. The history is provided by the patient. No language interpreter was used.  Abdominal Pain The primary symptoms of the illness include abdominal pain, nausea and vomiting. The primary symptoms of the illness do not include fatigue, diarrhea, hematochezia or dysuria. The current episode started more than 2 days ago. The onset of the illness was sudden. The problem has been gradually worsening.  The abdominal pain began 2 days ago. The pain came on gradually. The abdominal pain has been unchanged since its onset. The abdominal pain is generalized. The abdominal pain does not radiate. The severity of the abdominal pain is 6/10. The abdominal pain is relieved by nothing. The abdominal pain is exacerbated by vomiting.  The illness is associated with eating. The patient states that she believes she is currently not pregnant. The patient has not had a change in bowel habit. Symptoms associated with the illness do not include constipation, urgency, hematuria, frequency or back pain. Significant associated medical issues do not include diabetes or substance abuse.     Andrea Nelson is a 46 y.o. female who presents to the Emergency Department complaining of sudden onset, persistence of constant, gradually worsening, moderate to severe epigastric pain onset Thursday.  The pain radiates nowhere.  PT was seen in the ED 2 days ago for the same sx and dx w/ acute pancreatitis.  Pt describes pain as cramping, burning, sharp, throbbing.   PT states that pain has worsened and she vomited this morning and is currently having dry heaves. . Pt denies diarrhea.  PT has had pancreatitis 4 times, and has been hospitalized for it once.There are no other associated symptoms and no other alleviating or aggravating factors.    PCP: DR Andrea Nelson Past Medical History  Diagnosis Date  . Colitis   . Lupus   . DDD (degenerative disc disease)   . Pancreatitis     Past Surgical History  Procedure Date  . Abdominal hysterectomy   . Appendectomy   . Cholecystectomy   . Cesarean section   . Abdominal surgery     No family history on file.  History  Substance Use Topics  . Smoking status: Current Everyday Smoker  . Smokeless tobacco: Not on file  . Alcohol Use: No    OB History    Grav Para Term Preterm Abortions TAB SAB Ect Mult Living                  Review of Systems  Constitutional: Negative for fatigue.  HENT: Negative for congestion, sinus pressure and ear discharge.   Eyes: Negative for discharge.  Respiratory: Negative for cough.   Cardiovascular: Negative for chest pain.  Gastrointestinal: Positive for nausea, vomiting and abdominal pain. Negative for diarrhea, constipation and hematochezia.  Genitourinary: Negative for dysuria, urgency, frequency and hematuria.  Musculoskeletal: Negative for back pain.  Skin: Negative for rash.  Neurological: Negative for seizures and headaches.  Hematological: Negative.   Psychiatric/Behavioral: Negative for hallucinations.    10 Systems reviewed and all are negative for acute  change except as noted in the HPI.    Allergies  Honey bee venom; Ibuprofen; Ketorolac tromethamine; and Penicillins  Home Medications   Current Outpatient Rx  Name Route Sig Dispense Refill  . ALBUTEROL SULFATE HFA 108 (90 BASE) MCG/ACT IN AERS Inhalation Inhale 2 puffs into the lungs every 6 (six) hours as needed. For shortness of breath     . CALCIUM CARBONATE ANTACID 500 MG PO CHEW Oral Chew 2  tablets by mouth daily as needed. indigestion    . OXYCODONE-ACETAMINOPHEN 5-325 MG PO TABS Oral Take 1-2 tablets by mouth every 6 (six) hours as needed for pain. 20 tablet 0  . PROMETHAZINE HCL 25 MG PO TABS Oral Take 0.5 tablets (12.5 mg total) by mouth every 6 (six) hours as needed for nausea. 10 tablet 0  . PROMETHAZINE HCL 25 MG PO TABS Oral Take 1 tablet (25 mg total) by mouth every 6 (six) hours as needed for nausea. 20 tablet 0    BP 115/64  Temp(Src) 98.1 F (36.7 C) (Oral)  Resp 16  Ht 5\' 8"  (1.727 m)  Wt 150 lb (68.04 kg)  BMI 22.81 kg/m2  SpO2 98%  Physical Exam  Constitutional: She is oriented to person, place, and time. She appears well-developed.  HENT:  Head: Normocephalic and atraumatic.  Eyes: Conjunctivae and EOM are normal. No scleral icterus.  Neck: Neck supple. No thyromegaly present.  Cardiovascular: Normal rate and regular rhythm.  Exam reveals no gallop and no friction rub.   No murmur heard. Pulmonary/Chest: No stridor. She has no wheezes. She has no rales. She exhibits no tenderness.  Abdominal: She exhibits no distension. There is tenderness. There is no rebound.       Moderate epigastric tenderness  Musculoskeletal: Normal range of motion. She exhibits no edema.  Lymphadenopathy:    She has no cervical adenopathy.  Neurological: She is oriented to person, place, and time. Coordination normal.  Skin: No rash noted. No erythema.  Psychiatric: She has a normal mood and affect. Her behavior is normal.    ED Course  Procedures (including critical care time)  10:49 AM: EDP at bedside. All results reviewed and discussed with pt, questions answered, pt agreeable with plan.   DIAGNOSTIC STUDIES: Oxygen Saturation is 98% on room air, normal by my interpretation.    COORDINATION OF CARE:    Labs Reviewed  COMPREHENSIVE METABOLIC PANEL - Abnormal; Notable for the following:    Chloride 95 (*)    Glucose, Bld 115 (*)    BUN 30 (*) DELTA CHECK NOTED     Calcium 10.7 (*)    Total Protein 8.4 (*)    AST 40 (*)    ALT 58 (*)    All other components within normal limits  CBC  DIFFERENTIAL  LIPASE, BLOOD   Dg Abd Acute W/chest  09/17/2011  *RADIOLOGY REPORT*  Clinical Data: Upper abdominal pain  ACUTE ABDOMEN SERIES (ABDOMEN 2 VIEW & CHEST 1 VIEW)  Comparison: 09/07/2011 chest x-ray  Findings: Normal heart.  Clear lungs  No disproportionate dilatation of bowel.  No free intraperitoneal gas.  Postoperative changes in the right side of the abdomen.  IMPRESSION: Nonobstructive bowel gas pattern.  Original Report Authenticated By: Donavan Burnet, M.D.     No diagnosis found.    MDM   The chart was scribed for me under my direct supervision.  I personally performed the history, physical, and medical decision making and all procedures in the evaluation of this patient.[name]  Andrea Lennert, MD 09/19/11 1121

## 2011-09-19 NOTE — Progress Notes (Signed)
Patient arrived to floor, Md paged and made aware

## 2011-09-19 NOTE — H&P (Signed)
PCP:   Cassell Smiles., MD, MD   Chief Complaint:  Vomiting, epigastric pain  HPI: This is a 46 y/o female who presents to the ED with complaints of vomiting and epigastric pain.  She reports the onset of her pain approx 2 weeks ago.  She was evaluated in the ED and was told she had pancreatitis, although her serum lipase was normal.  She reports a similar episode occuring approx 1 year ago when she was in [place], [place].  She was treated for pancreatitis at that time and had gotten better.  She reports having an endoscopy at that time which revealed an esophageal ulcer.  She did not have any recurrence of her symptoms until now. She reports that her pain has not really improved over the past 2 weeks.  Patient has had onset of vomiting over the last few days and has been unable to keep down anything by mouth. She reports the vomitus as bilious without blood.  She denies any diarrhea, no blood in stools.  She does not take any NSAIDs. She describes the pain as epigastric, occasionally radiating to her back. Since she was unable to tolerate po medications, she was referred for admission.   Allergies:   Allergies  Allergen Reactions  . Honey Bee Venom Anaphylaxis  . Ibuprofen Anaphylaxis    Mouth swelling and itching  . Ketorolac Tromethamine Hives    Blisters near injection site  . Penicillins Hives and Swelling      Past Medical History  Diagnosis Date  . Colitis   . Lupus   . DDD (degenerative disc disease)   . Pancreatitis   . Cancer     Past Surgical History  Procedure Date  . Abdominal hysterectomy   . Appendectomy   . Cholecystectomy   . Cesarean section   . Abdominal surgery     Prior to Admission medications   Medication Sig Start Date End Date Taking? Authorizing Provider  albuterol (VENTOLIN HFA) 108 (90 BASE) MCG/ACT inhaler Inhale 2 puffs into the lungs every 6 (six) hours as needed. For shortness of breath    Yes Historical Provider, MD  calcium carbonate (TUMS -  DOSED IN MG ELEMENTAL CALCIUM) 500 MG chewable tablet Chew 2 tablets by mouth daily as needed. indigestion   Yes Historical Provider, MD  lidocaine (LIDODERM) 5 % Place 1 patch onto the skin daily as needed. For hip pain Remove & Discard patch within 12 hours or as directed by MD   Yes Historical Provider, MD  oxyCODONE-acetaminophen (PERCOCET) 5-325 MG per tablet Take 1-2 tablets by mouth every 6 (six) hours as needed for pain. 09/17/11 09/27/11 Yes Dayton Bailiff, MD  promethazine (PHENERGAN) 25 MG tablet Take 1 tablet (25 mg total) by mouth every 6 (six) hours as needed for nausea. 09/17/11 09/24/11 Yes Dayton Bailiff, MD    Social History:  reports that she has been smoking.  She does not have any smokeless tobacco history on file. She reports that she does not drink alcohol or use illicit drugs.  History reviewed. No pertinent family history.  Review of Systems: Positives in bold Constitutional: Denies fever, chills, diaphoresis, appetite change and fatigue.  HEENT: Denies photophobia, eye pain, redness, hearing loss, ear pain, congestion, sore throat, rhinorrhea, sneezing, mouth sores, trouble swallowing, neck pain, neck stiffness and tinnitus.   Respiratory: Denies SOB, DOE, cough, chest tightness,  and wheezing.   Cardiovascular: Denies chest pain, palpitations and leg swelling.  Gastrointestinal: Denies nausea, vomiting, abdominal pain, diarrhea, constipation,  blood in stool and abdominal distention.  Genitourinary: Denies dysuria, urgency, frequency, hematuria, flank pain and difficulty urinating.  Musculoskeletal: Denies myalgias, back pain, joint swelling, arthralgias and gait problem.  Skin: Denies pallor, rash and wound.  Neurological: Denies dizziness, seizures, syncope, weakness, light-headedness, numbness and headaches.  Hematological: Denies adenopathy. Easy bruising, personal or family bleeding history  Psychiatric/Behavioral: Denies suicidal ideation, mood changes, confusion,  nervousness, sleep disturbance and agitation   Physical Exam: Blood pressure 116/73, pulse 73, temperature 98.2 F (36.8 C), temperature source Oral, resp. rate 18, height 5\' 8"  (1.727 m), weight 67.8 kg (149 lb 7.6 oz), SpO2 93.00%. General: Patient is lying in bed, doesn't appear in acute distress, alert and oriented x3 HEENT: Normocephalic, atraumatic, pupils are equal round reactive to light and accommodation Neck: Supple Chest: Clear to auscultation bilaterally Cardiac: S1, S2, regular rate and rhythm Abdomen: Soft, mild tenderness in the epigastrium, nondistended, bowel sounds are active Extremities: No cyanosis, clubbing, edema Neurologic: Grossly intact, nonfocal Skin: Intact, no visible rashes  Labs on Admission:  Results for orders placed during the hospital encounter of 09/19/11 (from the past 48 hour(s))  CBC     Status: Normal   Collection Time   09/19/11  7:42 AM      Component Value Range Comment   WBC 7.9  4.0 - 10.5 (K/uL)    RBC 4.80  3.87 - 5.11 (MIL/uL)    Hemoglobin 14.8  12.0 - 15.0 (g/dL)    HCT 46.9  62.9 - 52.8 (%)    MCV 91.0  78.0 - 100.0 (fL)    MCH 30.8  26.0 - 34.0 (pg)    MCHC 33.9  30.0 - 36.0 (g/dL)    RDW 41.3  24.4 - 01.0 (%)    Platelets 220  150 - 400 (K/uL)   DIFFERENTIAL     Status: Normal   Collection Time   09/19/11  7:42 AM      Component Value Range Comment   Neutrophils Relative 69  43 - 77 (%)    Neutro Abs 5.5  1.7 - 7.7 (K/uL)    Lymphocytes Relative 24  12 - 46 (%)    Lymphs Abs 1.9  0.7 - 4.0 (K/uL)    Monocytes Relative 4  3 - 12 (%)    Monocytes Absolute 0.3  0.1 - 1.0 (K/uL)    Eosinophils Relative 2  0 - 5 (%)    Eosinophils Absolute 0.2  0.0 - 0.7 (K/uL)    Basophils Relative 0  0 - 1 (%)    Basophils Absolute 0.0  0.0 - 0.1 (K/uL)   COMPREHENSIVE METABOLIC PANEL     Status: Abnormal   Collection Time   09/19/11  7:42 AM      Component Value Range Comment   Sodium 136  135 - 145 (mEq/L)    Potassium 3.6  3.5 - 5.1  (mEq/L)    Chloride 95 (*) 96 - 112 (mEq/L)    CO2 26  19 - 32 (mEq/L)    Glucose, Bld 115 (*) 70 - 99 (mg/dL)    BUN 30 (*) 6 - 23 (mg/dL) DELTA CHECK NOTED   Creatinine, Ser 0.67  0.50 - 1.10 (mg/dL)    Calcium 27.2 (*) 8.4 - 10.5 (mg/dL)    Total Protein 8.4 (*) 6.0 - 8.3 (g/dL)    Albumin 4.8  3.5 - 5.2 (g/dL)    AST 40 (*) 0 - 37 (U/L)    ALT 58 (*) 0 -  35 (U/L)    Alkaline Phosphatase 71  39 - 117 (U/L)    Total Bilirubin 0.7  0.3 - 1.2 (mg/dL)    GFR calc non Af Amer >90  >90 (mL/min)    GFR calc Af Amer >90  >90 (mL/min)   LIPASE, BLOOD     Status: Normal   Collection Time   09/19/11  7:42 AM      Component Value Range Comment   Lipase 19  11 - 59 (U/L)   URINALYSIS, ROUTINE W REFLEX MICROSCOPIC     Status: Abnormal   Collection Time   09/19/11 11:14 AM      Component Value Range Comment   Color, Urine YELLOW  YELLOW     APPearance CLOUDY (*) CLEAR     Specific Gravity, Urine 1.015  1.005 - 1.030     pH 6.5  5.0 - 8.0     Glucose, UA NEGATIVE  NEGATIVE (mg/dL)    Hgb urine dipstick NEGATIVE  NEGATIVE     Bilirubin Urine NEGATIVE  NEGATIVE     Ketones, ur NEGATIVE  NEGATIVE (mg/dL)    Protein, ur TRACE (*) NEGATIVE (mg/dL)    Urobilinogen, UA 0.2  0.0 - 1.0 (mg/dL)    Nitrite POSITIVE (*) NEGATIVE     Leukocytes, UA NEGATIVE  NEGATIVE    URINE MICROSCOPIC-ADD ON     Status: Abnormal   Collection Time   09/19/11 11:14 AM      Component Value Range Comment   Squamous Epithelial / LPF RARE  RARE     WBC, UA 3-6  <3 (WBC/hpf)    RBC / HPF 0-2  <3 (RBC/hpf)    Bacteria, UA MANY (*) RARE      Radiological Exams on Admission: Ct Abdomen Pelvis Wo Contrast  09/19/2011  *RADIOLOGY REPORT*  Clinical Data: Upper abdominal pain, history of uterine cancer, prior appendectomy, prior hysterectomy, prior cholecystectomy  CT ABDOMEN AND PELVIS WITHOUT CONTRAST  Technique:  Multidetector CT imaging of the abdomen and pelvis was performed following the standard protocol without  intravenous contrast.  Comparison: 05/16/2011  Findings: Lung bases are unremarkable.  Small amount of contrast is noted in distal esophagus.  Nonspecific mild thickening of distal esophageal wall.  This may be due to gastroesophageal reflux disease.  Clinical correlation is necessary.  Heart size is within normal limits.  Study is limited without IV contrast.  Unenhanced liver shows no biliary ductal dilatation. The patient is status post cholecystectomy.  Unenhanced spleen, pancreas and adrenal glands are unremarkable.  Small accessory splenule is noted.  Unenhanced kidneys are symmetrical in size.  There is no nephrolithiasis.  No hydronephrosis or hydroureter.  Mild atherosclerotic calcifications distal abdominal aorta and the iliac arteries.  No aortic aneurysm.  Oral contrast material was given to the patient.  There is no small bowel or colonic obstruction.  No ascites or free air.  No adenopathy.  No pericecal inflammation.  Limited assessment of the transverse and proximal left colon which is empty collapsed non opacified with contrast.  Some stool noted in the left colon.  The appendix is surgically absent.  There is a low-lying cecum with tip in the right anterior pelvis just above the urinary bladder.  The uterus is surgically absent. Bowel visualized unenhanced ovary is unremarkable.  Small amount of pelvic free fluid noted in the posterior pelvis.  No distal colonic obstruction.  No destructive bony lesions are noted within pelvis. The urinary bladder is unremarkable.  Sagittal images  of the spine are unremarkable.  IMPRESSION:  1.  No acute inflammatory process within abdomen or pelvis.  There is a low-lying cecum.  No pericecal inflammation.  Status post appendectomy. 2.  Status post cholecystectomy.  4.  No small bowel obstruction.  No ascites or free air. 5.  No hydronephrosis or hydroureter. 6.  Surgically absent uterus.  Original Report Authenticated By: Natasha Mead, M.D.     Assessment/Plan Active Problems:  Epigastric pain  Persistent vomiting  Dehydration  History of systemic lupus erythematosus (SLE)  Plan:  Patient will be admitted for supportive treatment.   We will start the patient on a PPI The etiology of her pain may possibly be peptic ulcer disease vs. Gastritis/esophagitis Her liver enzymes are mildly elevated.  This may be just reactive, will repeat in am She will receive anti emetics and pain management IV fluids for dehydration Clear liquids Will ask GI to see for possible endoscopy Will keep NPO after midnight incase a procedure needs to be done in am.  Time Spent on Admission:  Lorianne Malbrough [facility] Pager: [phone] 09/19/2011, 2:22 PM

## 2011-09-19 NOTE — Plan of Care (Signed)
Problem: Phase II Progression Outcomes Goal: Progress activity as tolerated unless otherwise ordered Outcome: Progressing Pt ambulates self in room.

## 2011-09-19 NOTE — ED Notes (Signed)
Pt was seen here Friday and dx with acute pancreatitis. Same pain to chest as she was having Friday, which all began Thursday. Pt states she has vomited this morning and now has dry heaves. Pain to chest described as cramping, burning, sharp, throbbing pain.

## 2011-09-20 ENCOUNTER — Encounter (HOSPITAL_COMMUNITY): Payer: Self-pay | Admitting: Urgent Care

## 2011-09-20 ENCOUNTER — Encounter (HOSPITAL_COMMUNITY)
Admission: EM | Disposition: A | Discharge status: Home / Self Care | Payer: Self-pay | Source: Home / Self Care | Attending: Internal Medicine

## 2011-09-20 DIAGNOSIS — M329 Systemic lupus erythematosus, unspecified: Secondary | ICD-10-CM

## 2011-09-20 DIAGNOSIS — E860 Dehydration: Secondary | ICD-10-CM

## 2011-09-20 DIAGNOSIS — R112 Nausea with vomiting, unspecified: Secondary | ICD-10-CM

## 2011-09-20 DIAGNOSIS — K210 Gastro-esophageal reflux disease with esophagitis: Secondary | ICD-10-CM

## 2011-09-20 DIAGNOSIS — R1013 Epigastric pain: Secondary | ICD-10-CM

## 2011-09-20 DIAGNOSIS — K228 Other specified diseases of esophagus: Secondary | ICD-10-CM

## 2011-09-20 DIAGNOSIS — K219 Gastro-esophageal reflux disease without esophagitis: Secondary | ICD-10-CM

## 2011-09-20 DIAGNOSIS — R1115 Cyclical vomiting syndrome unrelated to migraine: Secondary | ICD-10-CM

## 2011-09-20 DIAGNOSIS — K3184 Gastroparesis: Secondary | ICD-10-CM

## 2011-09-20 HISTORY — PX: ESOPHAGOGASTRODUODENOSCOPY: SHX5428

## 2011-09-20 LAB — COMPREHENSIVE METABOLIC PANEL
Albumin: 3.3 g/dL — ABNORMAL LOW (ref 3.5–5.2)
Alkaline Phosphatase: 52 U/L (ref 39–117)
BUN: 14 mg/dL (ref 6–23)
Creatinine, Ser: 0.71 mg/dL (ref 0.50–1.10)
Potassium: 3.3 mEq/L — ABNORMAL LOW (ref 3.5–5.1)
Total Protein: 6.1 g/dL (ref 6.0–8.3)

## 2011-09-20 LAB — CBC
HCT: 35.2 % — ABNORMAL LOW (ref 36.0–46.0)
Hemoglobin: 11.8 g/dL — ABNORMAL LOW (ref 12.0–15.0)
MCV: 91.9 fL (ref 78.0–100.0)
RBC: 3.83 MIL/uL — ABNORMAL LOW (ref 3.87–5.11)
WBC: 6.6 10*3/uL (ref 4.0–10.5)

## 2011-09-20 SURGERY — EGD (ESOPHAGOGASTRODUODENOSCOPY)
Anesthesia: Moderate Sedation

## 2011-09-20 MED ORDER — ONDANSETRON HCL 4 MG/2ML IJ SOLN
4.0000 mg | Freq: Four times a day (QID) | INTRAMUSCULAR | Status: DC
  Administered 2011-09-19 – 2011-09-24 (×15): 4 mg via INTRAVENOUS
  Filled 2011-09-20 (×15): qty 2

## 2011-09-20 MED ORDER — MEPERIDINE HCL 100 MG/ML IJ SOLN
INTRAMUSCULAR | Status: DC | PRN
  Administered 2011-09-20 (×3): 50 mg via INTRAVENOUS

## 2011-09-20 MED ORDER — PROMETHAZINE HCL 25 MG/ML IJ SOLN
INTRAMUSCULAR | Status: DC | PRN
  Administered 2011-09-20: 25 mg via INTRAVENOUS

## 2011-09-20 MED ORDER — MIDAZOLAM HCL 5 MG/5ML IJ SOLN
INTRAMUSCULAR | Status: DC | PRN
  Administered 2011-09-20 (×4): 2 mg via INTRAVENOUS

## 2011-09-20 MED ORDER — SODIUM CHLORIDE 0.9 % IJ SOLN
INTRAMUSCULAR | Status: AC
  Filled 2011-09-20: qty 3

## 2011-09-20 MED ORDER — MEPERIDINE HCL 100 MG/ML IJ SOLN
INTRAMUSCULAR | Status: AC
  Filled 2011-09-20: qty 2

## 2011-09-20 MED ORDER — MIDAZOLAM HCL 5 MG/5ML IJ SOLN
INTRAMUSCULAR | Status: AC
  Filled 2011-09-20: qty 10

## 2011-09-20 MED ORDER — BUTAMBEN-TETRACAINE-BENZOCAINE 2-2-14 % EX AERO
INHALATION_SPRAY | CUTANEOUS | Status: DC | PRN
  Administered 2011-09-20: 2 via TOPICAL

## 2011-09-20 MED ORDER — PROMETHAZINE HCL 25 MG/ML IJ SOLN
INTRAMUSCULAR | Status: AC
  Filled 2011-09-20: qty 1

## 2011-09-20 MED ORDER — STERILE WATER FOR IRRIGATION IR SOLN
Status: DC | PRN
  Administered 2011-09-20: 15:00:00

## 2011-09-20 MED ORDER — SODIUM CHLORIDE 0.9 % IJ SOLN
INTRAMUSCULAR | Status: AC
  Filled 2011-09-20: qty 10

## 2011-09-20 MED ORDER — SODIUM CHLORIDE 0.9 % IJ SOLN
INTRAMUSCULAR | Status: AC
  Administered 2011-09-20: 10 mL
  Filled 2011-09-20: qty 3

## 2011-09-20 MED ORDER — POTASSIUM CHLORIDE CRYS ER 20 MEQ PO TBCR
40.0000 meq | EXTENDED_RELEASE_TABLET | Freq: Once | ORAL | Status: AC
  Administered 2011-09-20: 40 meq via ORAL
  Filled 2011-09-20: qty 2

## 2011-09-20 MED ORDER — SUCRALFATE 1 GM/10ML PO SUSP
1.0000 g | Freq: Three times a day (TID) | ORAL | Status: DC
  Administered 2011-09-20 – 2011-09-25 (×14): 1 g via ORAL
  Filled 2011-09-20 (×15): qty 10

## 2011-09-20 MED ORDER — PROMETHAZINE HCL 25 MG/ML IJ SOLN
25.0000 mg | Freq: Four times a day (QID) | INTRAMUSCULAR | Status: DC | PRN
  Administered 2011-09-20 – 2011-09-24 (×12): 25 mg via INTRAVENOUS
  Filled 2011-09-20 (×12): qty 1

## 2011-09-20 NOTE — Progress Notes (Signed)
UR Chart Review Completed  

## 2011-09-20 NOTE — Consult Note (Signed)
Referring Provider: No ref. provider found Primary Care Physician:  Cassell Smiles., MD, MD Primary Gastroenterologist:  Dr. Jena Gauss Previous GI: Dr. Opal Sidles [facility])  Reason for Consultation:  Abdominal pain & vomiting  HPI: Andrea Nelson is a 46 y.o. female admitted w/ abdominal pain & vomiting.  Started a little over 2 weeks ago when she woke up 10:45pm with nausea & vomiting.  C/o Intermittent vomiting that lasted 4 days.  Seen in ER 1 week ago, was given Zofran & felt better for 4 days as she was taking zofran at home.  Then ran out zofran & symptoms started again.  She had a few phenergan suppositories which seemed to help as well.  Tells me she couldn't even keep down a sip of water.  Pain in the epigastric area that is intermittent, non-radiating.  Pain 10/10 at worst.  Pain is resolved with IV Dilaudid. C/o chest pain.  C/o heartburn & indigestion several days a week, but worse over the last 2 weeks.  Takes TUMS w/ some relief. She denies any NSAIDs. She gives history of being followed by Dr. Opal Sidles at [facility] when she lived in [place]. She moved here approximately one year ago. She gives history of recurrent peptic ulcer disease and "colitis". She does not believe she has inflammatory bowel disease, and does not remember being treated with antibiotics. She has had no diarrhea, rectal bleeding, or melena. He gives history of adenomatous colon polyps for 2 years ago there. She tells me she was diagnosed with pancreatitis in the recent past and has had several episodes, however last ER visit there was no documentation of an elevated lipase. She's been n.p.o. Last dose of Lovenox was 1800.[name]  Past Medical History  Diagnosis Date  . Colitis 11/2010    ?  [name] Lupus   . DDD (degenerative disc disease)   . Pancreatitis 2012    [name]  . Uterine cancer 1996  . Adenomatous polyp of colon 02/2010  . PUD (peptic ulcer disease)     Past Surgical History  Procedure Date  .  Abdominal hysterectomy 1996    w/ right salpingoopherectomy  . Appendectomy 1977  . Cholecystectomy 1987  . Cesarean section 1987  . Abdominal exploration surgery 1996    fibroid  . Colonoscopy w/ biopsies 02/2010    Dr Opal Sidles, [facility]  . Esophagogastroduodenoscopy 02/2010    Dr Lamar Laundry  . Shoulder surgery     Prior to Admission medications   Medication Sig Start Date End Date Taking? Authorizing Provider  albuterol (VENTOLIN HFA) 108 (90 BASE) MCG/ACT inhaler Inhale 2 puffs into the lungs every 6 (six) hours as needed. For shortness of breath    Yes Historical Provider, MD  calcium carbonate (TUMS - DOSED IN MG ELEMENTAL CALCIUM) 500 MG chewable tablet Chew 2 tablets by mouth daily as needed. indigestion   Yes Historical Provider, MD  lidocaine (LIDODERM) 5 % Place 1 patch onto the skin daily as needed. For hip pain Remove & Discard patch within 12 hours or as directed by MD   Yes Historical Provider, MD  oxyCODONE-acetaminophen (PERCOCET) 5-325 MG per tablet Take 1-2 tablets by mouth every 6 (six) hours as needed for pain. 09/17/11 09/27/11 Yes Dayton Bailiff, MD  promethazine (PHENERGAN) 25 MG tablet Take 1 tablet (25 mg total) by mouth every 6 (six) hours as needed for nausea. 09/17/11 09/24/11 Yes Dayton Bailiff, MD    Current Facility-Administered Medications  Medication Dose Route Frequency Provider Last Rate Last  Dose  . 0.9 %  sodium chloride infusion   Intravenous Continuous Erick Blinks, MD 100 mL/hr at 09/20/11 0751    . acetaminophen (TYLENOL) tablet 650 mg  650 mg Oral Q6H PRN Erick Blinks, MD       Or  . acetaminophen (TYLENOL) suppository 650 mg  650 mg Rectal Q6H PRN Erick Blinks, MD      . albuterol (PROVENTIL HFA;VENTOLIN HFA) 108 (90 BASE) MCG/ACT inhaler 2 puff  2 puff Inhalation Q6H PRN Erick Blinks, MD      . enoxaparin (LOVENOX) injection 40 mg  40 mg Subcutaneous Q24H Erick Blinks, MD   40 mg at 09/19/11 1654  . HYDROmorphone (DILAUDID) injection 1 mg  1  mg Intravenous Q4H PRN Erick Blinks, MD   1 mg at 09/20/11 0751  . iohexol (OMNIPAQUE) 300 MG/ML solution 40 mL  40 mL Oral Once PRN [name], MD   40 mL at 09/19/11 0914  . ondansetron (ZOFRAN) 4 MG/2ML injection        4 mg at 09/19/11 0916  . ondansetron (ZOFRAN) injection 4 mg  4 mg Intravenous Q6H Joselyn Arrow, NP      . pantoprazole (PROTONIX) injection 40 mg  40 mg Intravenous Q12H Erick Blinks, MD   40 mg at 09/19/11 2128  . promethazine (PHENERGAN) injection 25 mg  25 mg Intravenous Q6H PRN Joselyn Arrow, NP      . sodium chloride 0.9 % injection        10 mL at 09/19/11 2128  . DISCONTD: 0.9 %  sodium chloride infusion   Intravenous STAT [name], MD 75 mL/hr at 09/19/11 1429    . DISCONTD: HYDROmorphone (DILAUDID) injection 1 mg  1 mg Intravenous Q4H PRN [name], MD   1 mg at 09/19/11 1429  . DISCONTD: ondansetron (ZOFRAN) injection 4 mg  4 mg Intravenous Q8H PRN [name], MD   4 mg at 09/19/11 1429  . DISCONTD: ondansetron (ZOFRAN) injection 4 mg  4 mg Intravenous Q6H PRN Erick Blinks, MD   4 mg at 09/20/11 0327  . DISCONTD: ondansetron (ZOFRAN) tablet 4 mg  4 mg Oral Q6H PRN Erick Blinks, MD        Allergies as of 09/19/2011 - Review Complete 09/19/2011  Allergen Reaction Noted  . Honey bee venom Anaphylaxis 02/27/2011  . Ibuprofen Anaphylaxis 02/26/2011  . Ketorolac tromethamine Hives 02/26/2011  . Penicillins Hives and Swelling 02/26/2011    Family History  Problem Relation Age of Onset  . Ulcers Mother   . Colon cancer Maternal Grandmother     History   Social History  . Marital Status: Divorced    Spouse Name: N/A    Number of Children: 2  . Years of Education: N/A   Occupational History  . unemployed; [name]    Social History Main Topics  . Smoking status: Current Everyday Smoker -- 0.5 packs/day for 25 years    Types: Cigarettes  . Smokeless tobacco: Not on file  . Alcohol Use: No  . Drug Use: No    . Sexually Active: Yes    Birth Control/ Protection: Surgical   Other Topics Concern  . Not on file   Social History Narrative   Lives w/ grandfather & 2 sons, next to her mother's house   Review of Systems: Gen: See history of present illness CV: Denies angina, palpitations, syncope, orthopnea, PND, peripheral edema, and claudication. Resp: Denies dyspnea at rest,  dyspnea with exercise, cough, sputum, wheezing, coughing up blood, and pleurisy. GI: Denies vomiting blood, jaundice, and fecal incontinence.   Denies dysphagia or odynophagia. GU : She tells me she urinates only once or twice per day. She denies any dysuria, hematuria, or pyuria. MS: Denies joint pain, limitation of movement, and swelling, stiffness, low back pain, extremity pain. Denies muscle weakness, cramps, atrophy.  Derm: Denies rash, itching, dry skin, hives, moles, warts, or unhealing ulcers.  Psych: Denies depression, anxiety, memory loss, suicidal ideation, hallucinations, paranoia, and confusion. Heme: Denies bruising, bleeding, and enlarged lymph nodes. Neuro:  Denies any headaches, dizziness, paresthesias. Endo:  Denies any problems with DM, thyroid, adrenal function.  Physical Exam: Vital signs in last 24 hours: Temp:  [98 F (36.7 C)-98.4 F (36.9 C)] 98 F (36.7 C) (05/13 0438) Pulse Rate:  [60-89] 60  (05/13 0438) Resp:  [16-20] 16  (05/13 0438) BP: (99-120)/(60-79) 110/68 mmHg (05/13 0438) SpO2:  [91 %-98 %] 91 % (05/13 0438) Weight:  [149 lb 7.6 oz (67.8 kg)] 149 lb 7.6 oz (67.8 kg) (05/12 1233) Last BM Date: 09/18/11 General:   Alert,  Well-developed, well-nourished, pleasant and cooperative in NAD Head:  Normocephalic and atraumatic. Eyes:  Sclera clear, no icterus.   Conjunctiva pink. Ears:  Normal auditory acuity. Nose:  No deformity, discharge, or lesions. Mouth:  No deformity or lesions,oropharynx pink & moist. Neck:  Supple; no masses or thyromegaly. Lungs:  Clear throughout to  auscultation.   No wheezes, crackles, or rhonchi. No acute distress. Heart:  Regular rate and rhythm; no murmurs, clicks, rubs,  or gallops. Abdomen:  Normal bowel sounds.  No bruits.  Soft and non-distended.  Moderate epigastric tenderness to deep palpation. No without masses, hepatosplenomegaly or hernias noted.  No guarding or rebound tenderness.   Rectal:  Deferred. Msk:  Symmetrical without gross deformities. Normal posture. Pulses:  Normal pulses noted. Extremities:  No clubbing or edema. Neurologic:  Alert and oriented x4;  grossly normal neurologically. Skin:  Intact without significant lesions or rashes. Lymph Nodes:  No significant cervical adenopathy. Psych:  Alert and cooperative. Normal mood and affect.  Intake/Output from previous day: 05/12 0701 - 05/13 0700 In: 348.3 [P.O.:240; I.V.:108.3] Out: -  Intake/Output this shift:    Lab Results:  Basename 09/20/11 0437 09/19/11 0742 09/17/11 1236  WBC 6.6 7.9 8.1  HGB 11.8* 14.8 15.1*  HCT 35.2* 43.7 44.7  PLT 224 220 316   BMET  Basename 09/20/11 0437 09/19/11 0742 09/17/11 1236  NA 139 136 139  K 3.3* 3.6 3.8  CL 107 95* 98  CO2 26 26 22   GLUCOSE 96 115* 134*  BUN 14 30* 14  CREATININE 0.71 0.67 0.56  CALCIUM 8.8 10.7* 11.8*   LFT  Basename 09/20/11 0437 09/19/11 0742 09/17/11 1236  PROT 6.1 8.4* 8.5*  ALBUMIN 3.3* 4.8 4.8  AST 18 40* 14  ALT 33 58* 12  ALKPHOS 52 71 78  BILITOT 0.5 0.7 0.3  BILIDIR -- -- --  IBILI -- -- --  LIPASE -- 19 24  AMYLASE -- -- --   Studies/Results: Ct Abdomen Pelvis Wo Contrast  09/19/2011  *RADIOLOGY REPORT*  Clinical Data: Upper abdominal pain, history of uterine cancer, prior appendectomy, prior hysterectomy, prior cholecystectomy  CT ABDOMEN AND PELVIS WITHOUT CONTRAST  Technique:  Multidetector CT imaging of the abdomen and pelvis was performed following the standard protocol without intravenous contrast.  Comparison: 05/16/2011  Findings: Lung bases are unremarkable.   Small amount of  contrast is noted in distal esophagus.  Nonspecific mild thickening of distal esophageal wall.  This may be due to gastroesophageal reflux disease.  Clinical correlation is necessary.  Heart size is within normal limits.  Study is limited without IV contrast.  Unenhanced liver shows no biliary ductal dilatation. The patient is status post cholecystectomy.  Unenhanced spleen, pancreas and adrenal glands are unremarkable.  Small accessory splenule is noted.  Unenhanced kidneys are symmetrical in size.  There is no nephrolithiasis.  No hydronephrosis or hydroureter.  Mild atherosclerotic calcifications distal abdominal aorta and the iliac arteries.  No aortic aneurysm.  Oral contrast material was given to the patient.  There is no small bowel or colonic obstruction.  No ascites or free air.  No adenopathy.  No pericecal inflammation.  Limited assessment of the transverse and proximal left colon which is empty collapsed non opacified with contrast.  Some stool noted in the left colon.  The appendix is surgically absent.  There is a low-lying cecum with tip in the right anterior pelvis just above the urinary bladder.  The uterus is surgically absent. Bowel visualized unenhanced ovary is unremarkable.  Small amount of pelvic free fluid noted in the posterior pelvis.  No distal colonic obstruction.  No destructive bony lesions are noted within pelvis. The urinary bladder is unremarkable.  Sagittal images of the spine are unremarkable.  IMPRESSION:  1.  No acute inflammatory process within abdomen or pelvis.  There is a low-lying cecum.  No pericecal inflammation.  Status post appendectomy. 2.  Status post cholecystectomy.  4.  No small bowel obstruction.  No ascites or free air. 5.  No hydronephrosis or hydroureter. 6.  Surgically absent uterus.  Original Report Authenticated By: Natasha Mead, M.D.    Impression: Andrea Nelson is a pleasant 46 y.o. female with two-week history of epigastric pain,  intractable nausea and vomiting. She has history of peptic ulcer disease treated by Dr. Opal Sidles at [facility] over the past several years. She denies any NSAID use. She is unsure of her H. pylori status. She is going to need EGD for further evaluation to look for recurrent peptic ulcer disease, gastritis, possible H. pylori, and gastroparesis remains in the differential as well.  I have discussed risks & benefits which include, but are not limited to, bleeding, infection, perforation & drug reaction.  The patient agrees with this plan & written consent will be obtained.    Mild hypokalemia to be addressed by the attending.  Plan: 1. Old records requested from Dr. Opal Sidles including colonoscopy and EGD reports with biopsy reports 2. Change Zofran to 4 mg IV every 6 hours around-the-clock 3. Phenergan 25 mg IV q. 6 hours when necessary 4. NPO for procedures 5. EGD with Dr. Jena Gauss today 6. May need long-term PPI, but will decide pending EGD findings 7. Potassium repletion per attending  We would like to thank you for the opportunity to participate in the care of Axel P Kloc.   LOS: 1 day   Lorenza Burton  09/20/2011, 8:59 AM [facility]   Pt seen and examined; agree with above assessment and recommendations. Agree with pursuing an EGD today. The risks, benefits, limitations, alternatives and imponderables have been reviewed with the patient. Potential for esophageal dilation, biopsy, etc. have also been reviewed.  Questions have been answered. All parties agreeable.

## 2011-09-20 NOTE — Op Note (Signed)
[facility] [address] [place], [place]  [address]  ENDOSCOPY PROCEDURE REPORT  PATIENT:  Andrea Nelson, Andrea Nelson  MR#:  [id number] BIRTHDATE:  03-May-1966, 46 yrs. old  GENDER:  female  ENDOSCOPIST:  R. Roetta Sessions, MD Caleen Essex Referred by:  Artis Delay, M.D.  PROCEDURE DATE:  09/20/2011 PROCEDURE:  EGD with esophageal biopsy  INDICATIONS:  nausea vomiting epigastric pain. Daily GERD.  INFORMED CONSENT:   The risks, benefits, limitations, alternatives and imponderables have been discussed.  The potential for biopsy, esophogeal dilation, etc. have also been reviewed.  Questions have been answered.  All parties agreeable.  Please see the history and physical in the medical record for more information.  MEDICATIONS:  Phenergan 25 mg IV and Versed 8 mg IV and Demerol 150 mg IV in divided doses. Cetacaine spray.  DESCRIPTION OF PROCEDURE:   The EG-[id number] ([id number]) endoscope was introduced through the mouth and advanced to the second portion of the duodenum without difficulty or limitations.  The mucosal surfaces were surveyed very carefully during advancement of the scope and upon withdrawal.  Retroflexion view of the proximal stomach and esophagogastric junction was performed.  <<PROCEDUREIMAGES>>  FINDINGS:  couple of 3-5 mm raised pale nodules in the esophagus. Circumferential distal esophageal erosions within 5 mm the GE junction. No stricture. No Barrett's. EG junction easily traversed. Stomach empty. Small hiatal hernia. Mucosa normal. Patent pylorus. Normal first and second portion of the duodenum.  THERAPEUTIC / DIAGNOSTIC MANEUVERS PERFORMED:  one of the nodules esophagus biopsy for a swallow study  COMPLICATIONS:   None  IMPRESSION:  Probable benign esophageal nodules-status post biopsy. Circumferential distal esophageal erosions consistent with mild erosive reflux esophagitis. Small hiatal hernia.  RECOMMENDATIONS:    Agree with twice a day proton pump  inhibitor therapy. Add Carafate suspension 1 g 4 times a day. Advance diet. Hopefully, home soon. ______________________________ R. Roetta Sessions, MD Caleen Essex  CC:  n. eSIGNED:   R. Roetta Sessions at 09/20/2011 03:44 PM  Desena, Rinaldo Cloud,   [phone]

## 2011-09-20 NOTE — Progress Notes (Signed)
Subjective: Pain is controlled with IV pain medicine.  Still having nausea, no vomiting  Objective: Vital signs in last 24 hours: Temp:  [98 F (36.7 C)-98.4 F (36.9 C)] 98 F (36.7 C) (05/13 0438) Pulse Rate:  [60-84] 60  (05/13 0438) Resp:  [16-18] 16  (05/13 0438) BP: (99-120)/(60-75) 110/68 mmHg (05/13 0438) SpO2:  [91 %-97 %] 91 % (05/13 0438) Weight:  [67.8 kg (149 lb 7.6 oz)] 67.8 kg (149 lb 7.6 oz) (05/12 1233) Weight change:  Last BM Date: 09/18/11  Intake/Output from previous day: 05/12 0701 - 05/13 0700 In: 348.3 [P.O.:240; I.V.:108.3] Out: -      Physical Exam: General: Alert, awake, oriented x3, in no acute distress. HEENT: No bruits, no goiter. Heart: Regular rate and rhythm, without murmurs, rubs, gallops. Lungs: Clear to auscultation bilaterally. Abdomen: Soft, nontender, nondistended, positive bowel sounds. Extremities: No clubbing cyanosis or edema with positive pedal pulses. Neuro: Grossly intact, nonfocal.    Lab Results: Basic Metabolic Panel:  Basename 09/20/11 0437 09/19/11 0742  NA 139 136  K 3.3* 3.6  CL 107 95*  CO2 26 26  GLUCOSE 96 115*  BUN 14 30*  CREATININE 0.71 0.67  CALCIUM 8.8 10.7*  MG -- --  PHOS -- --   Liver Function Tests:  [facility] 09/20/11 0437 09/19/11 0742  AST 18 40*  ALT 33 58*  ALKPHOS 52 71  BILITOT 0.5 0.7  PROT 6.1 8.4*  ALBUMIN 3.3* 4.8    Basename 09/19/11 0742 09/17/11 1236  LIPASE 19 24  AMYLASE -- --   No results found for this basename: AMMONIA:2 in the last 72 hours CBC:  Basename 09/20/11 0437 09/19/11 0742 09/17/11 1236  WBC 6.6 7.9 --  NEUTROABS -- 5.5 6.4  HGB 11.8* 14.8 --  HCT 35.2* 43.7 --  MCV 91.9 91.0 --  PLT 224 220 --   Cardiac Enzymes:  Basename 09/17/11 1236  CKTOTAL --  CKMB --  CKMBINDEX --  TROPONINI <0.30   BNP: No results found for this basename: PROBNP:3 in the last 72 hours D-Dimer: No results found for this basename: DDIMER:2 in the last 72  hours CBG: No results found for this basename: GLUCAP:6 in the last 72 hours Hemoglobin A1C: No results found for this basename: HGBA1C in the last 72 hours Fasting Lipid Panel: No results found for this basename: CHOL,HDL,LDLCALC,TRIG,CHOLHDL,LDLDIRECT in the last 72 hours Thyroid Function Tests: No results found for this basename: TSH,T4TOTAL,FREET4,T3FREE,THYROIDAB in the last 72 hours Anemia Panel: No results found for this basename: VITAMINB12,FOLATE,FERRITIN,TIBC,IRON,RETICCTPCT in the last 72 hours Coagulation: No results found for this basename: LABPROT:2,INR:2 in the last 72 hours Urine Drug Screen: Drugs of Abuse  No results found for this basename: labopia, cocainscrnur, labbenz, amphetmu, thcu, labbarb    Alcohol Level: No results found for this basename: ETH:2 in the last 72 hours Urinalysis:  Basename 09/19/11 1114 09/17/11 1355  COLORURINE YELLOW YELLOW  LABSPEC 1.015 1.015  PHURINE 6.5 7.0  GLUCOSEU NEGATIVE NEGATIVE  HGBUR NEGATIVE NEGATIVE  BILIRUBINUR NEGATIVE NEGATIVE  KETONESUR NEGATIVE NEGATIVE  PROTEINUR TRACE* 30*  UROBILINOGEN 0.2 0.2  NITRITE POSITIVE* NEGATIVE  LEUKOCYTESUR NEGATIVE NEGATIVE   No results found for this or any previous visit (from the past 240 hour(s)).  Studies/Results: Ct Abdomen Pelvis Wo Contrast  09/19/2011  *RADIOLOGY REPORT*  Clinical Data: Upper abdominal pain, history of uterine cancer, prior appendectomy, prior hysterectomy, prior cholecystectomy  CT ABDOMEN AND PELVIS WITHOUT CONTRAST  Technique:  Multidetector CT imaging of the abdomen  and pelvis was performed following the standard protocol without intravenous contrast.  Comparison: 05/16/2011  Findings: Lung bases are unremarkable.  Small amount of contrast is noted in distal esophagus.  Nonspecific mild thickening of distal esophageal wall.  This may be due to gastroesophageal reflux disease.  Clinical correlation is necessary.  Heart size is within normal limits.  Study  is limited without IV contrast.  Unenhanced liver shows no biliary ductal dilatation. The patient is status post cholecystectomy.  Unenhanced spleen, pancreas and adrenal glands are unremarkable.  Small accessory splenule is noted.  Unenhanced kidneys are symmetrical in size.  There is no nephrolithiasis.  No hydronephrosis or hydroureter.  Mild atherosclerotic calcifications distal abdominal aorta and the iliac arteries.  No aortic aneurysm.  Oral contrast material was given to the patient.  There is no small bowel or colonic obstruction.  No ascites or free air.  No adenopathy.  No pericecal inflammation.  Limited assessment of the transverse and proximal left colon which is empty collapsed non opacified with contrast.  Some stool noted in the left colon.  The appendix is surgically absent.  There is a low-lying cecum with tip in the right anterior pelvis just above the urinary bladder.  The uterus is surgically absent. Bowel visualized unenhanced ovary is unremarkable.  Small amount of pelvic free fluid noted in the posterior pelvis.  No distal colonic obstruction.  No destructive bony lesions are noted within pelvis. The urinary bladder is unremarkable.  Sagittal images of the spine are unremarkable.  IMPRESSION:  1.  No acute inflammatory process within abdomen or pelvis.  There is a low-lying cecum.  No pericecal inflammation.  Status post appendectomy. 2.  Status post cholecystectomy.  4.  No small bowel obstruction.  No ascites or free air. 5.  No hydronephrosis or hydroureter. 6.  Surgically absent uterus.  Original Report Authenticated By: Natasha Mead, M.D.    Medications: Scheduled Meds:   . enoxaparin  40 mg Subcutaneous Q24H  . ondansetron (ZOFRAN) IV  4 mg Intravenous Q6H  . pantoprazole (PROTONIX) IV  40 mg Intravenous Q12H  . sodium chloride      . DISCONTD: sodium chloride   Intravenous STAT   Continuous Infusions:   . sodium chloride 100 mL/hr at 09/20/11 0751   PRN  Meds:.acetaminophen, acetaminophen, albuterol, HYDROmorphone (DILAUDID) injection, promethazine, DISCONTD:  HYDROmorphone (DILAUDID) injection, DISCONTD: ondansetron (ZOFRAN) IV, DISCONTD: ondansetron (ZOFRAN) IV, DISCONTD: ondansetron  Assessment/Plan:  Active Problems:  Epigastric pain  Persistent vomiting  Dehydration  History of systemic lupus erythematosus (SLE)  Plan:  GI input appreciated Plan for endoscopy today  Continue supportive treatment with IVF, pain management, PPI and anti emetics Further plan pending endoscopy results.   LOS: 1 day   Doneta Bayman [facility] Pager: [phone] 09/20/2011, 10:06 AM

## 2011-09-21 ENCOUNTER — Encounter (HOSPITAL_COMMUNITY): Payer: Self-pay | Admitting: Urgent Care

## 2011-09-21 DIAGNOSIS — E860 Dehydration: Secondary | ICD-10-CM

## 2011-09-21 DIAGNOSIS — R1013 Epigastric pain: Secondary | ICD-10-CM

## 2011-09-21 DIAGNOSIS — M329 Systemic lupus erythematosus, unspecified: Secondary | ICD-10-CM

## 2011-09-21 DIAGNOSIS — R1115 Cyclical vomiting syndrome unrelated to migraine: Secondary | ICD-10-CM

## 2011-09-21 HISTORY — PX: ESOPHAGOGASTRODUODENOSCOPY: SHX1529

## 2011-09-21 MED ORDER — SODIUM CHLORIDE 0.9 % IJ SOLN
INTRAMUSCULAR | Status: AC
  Administered 2011-09-21: 10 mL
  Filled 2011-09-21: qty 3

## 2011-09-21 MED ORDER — SODIUM CHLORIDE 0.9 % IJ SOLN
INTRAMUSCULAR | Status: AC
  Filled 2011-09-21: qty 3

## 2011-09-21 MED ORDER — SODIUM CHLORIDE 0.9 % IJ SOLN
INTRAMUSCULAR | Status: AC
  Administered 2011-09-21: 16:00:00
  Filled 2011-09-21: qty 3

## 2011-09-21 MED ORDER — PANTOPRAZOLE SODIUM 40 MG IV SOLR
40.0000 mg | Freq: Two times a day (BID) | INTRAVENOUS | Status: DC
  Administered 2011-09-21 – 2011-09-24 (×6): 40 mg via INTRAVENOUS
  Filled 2011-09-21 (×7): qty 40

## 2011-09-21 MED ORDER — SODIUM CHLORIDE 0.9 % IJ SOLN
INTRAMUSCULAR | Status: AC
  Administered 2011-09-21: 20:00:00
  Filled 2011-09-21: qty 3

## 2011-09-21 NOTE — Progress Notes (Signed)
Patient complain of burning and leakage around Foley catheter. Dr. Felecia Shelling notified. New orders to d/c Foley.

## 2011-09-21 NOTE — Progress Notes (Signed)
REVIEWED.  

## 2011-09-21 NOTE — Care Management Note (Unsigned)
    Page 1 of 1   09/24/2011     2:50:09 PM   CARE MANAGEMENT NOTE 09/24/2011  Patient:  Andrea Nelson, Andrea Nelson   Account Number:  [id number]  Date Initiated:  09/21/2011  Documentation initiated by:  Rosemary Holms  Subjective/Objective Assessment:   Pt admitted from home with epigastric pain, vomiting.     Action/Plan:   Spoke to pt at bedside. No HH needs identified   Anticipated DC Date:  09/25/2011   Anticipated DC Plan:  HOME/SELF CARE      DC Planning Services  CM consult      Choice offered to / List presented to:             Status of service:  In process, will continue to follow Medicare Important Message given?   (If response is "NO", the following Medicare IM given date fields will be blank) Date Medicare IM given:   Date Additional Medicare IM given:    Discharge Disposition:    Per UR Regulation:    If discussed at Long Length of Stay Meetings, dates discussed:    Comments:  09/24/11 1100 Andrea Lumbert RN BSN CM No HH need identified  09/21/11 1115 Andrea Caison Leanord Hawking RN BSN CM

## 2011-09-21 NOTE — Progress Notes (Signed)
Subjective: Patient has continued nausea and epigastric discomfort. She has not vomited. She ate a few bites of scrambled eggs this morning. EGD showed yesterday showed esophageal nodules, small hiatal hernia, mild erosive esophagitis.  Objective: Vital signs in last 24 hours: Temp:  [98.1 F (36.7 C)] 98.1 F (36.7 C) (05/14 0553) Pulse Rate:  [65-115] 76  (05/14 0553) Resp:  [13-21] 18  (05/14 0553) BP: (70-141)/(48-86) 106/72 mmHg (05/14 0553) SpO2:  [97 %-100 %] 99 % (05/14 0553) Weight:  [150 lb (68.04 kg)] 150 lb (68.04 kg) (05/13 1408) Last BM Date: 09/18/11 General:   Alert,  Well-developed, well-nourished, pleasant and cooperative in NAD Eyes:  Sclera clear, no icterus.   Conjunctiva pink. Mouth:  No deformity or lesions, oropharynx pink & moist. Heart:  Regular rate and rhythm; no murmurs, clicks, rubs,  or gallops. Abdomen:  Soft, nontender and nondistended. No masses, hepatosplenomegaly or hernias noted. Normal bowel sounds, without guarding, and without rebound.   Msk:  Symmetrical without gross deformities. Pulses:  Normal pulses noted. Extremities:  Without clubbing or edema. Neurologic:  Alert and  oriented x4;  grossly normal neurologically. Skin:  Intact without significant lesions or rashes. Cervical Nodes:  No significant cervical adenopathy. Psych:  Alert and cooperative. Normal mood and affect.  Intake/Output from previous day: 05/13 0701 - 05/14 0700 In: 7522.2 [P.O.:1320; I.V.:6202.2] Out: -   Lab Results:  Basename 09/20/11 0437 09/19/11 0742  WBC 6.6 7.9  HGB 11.8* 14.8  HCT 35.2* 43.7  PLT 224 220   BMET  Basename 09/20/11 0437 09/19/11 0742  NA 139 136  K 3.3* 3.6  CL 107 95*  CO2 26 26  GLUCOSE 96 115*  BUN 14 30*  CREATININE 0.71 0.67  CALCIUM 8.8 10.7*   LFT  Basename 09/20/11 0437 09/19/11 0742  PROT 6.1 8.4*  ALBUMIN 3.3* 4.8  AST 18 40*  ALT 33 58*  ALKPHOS 52 71  BILITOT 0.5 0.7  BILIDIR -- --  IBILI -- --  LIPASE -- 19   AMYLASE -- --   Assessment: 1. Erosive reflux esophagitis: On PPI 2. Esophageal nodules: Biopsies pending 3. Nausea: Persistent. 4. Epigastric pain: Stable    Plan: 1. Gastric emptying study 2. Continue twice a day PPI 3. Carafate 4 times a day  LOS: 2 days   Andrea Nelson  09/21/2011, 9:17 AM

## 2011-09-21 NOTE — Progress Notes (Signed)
Subjective: Still nauseous and having epigastric pain, no vomiting  Objective: Vital signs in last 24 hours: Temp:  [97.8 F (36.6 C)-98.1 F (36.7 C)] 97.8 F (36.6 C) (05/14 1410) Pulse Rate:  [71-115] 72  (05/14 1410) Resp:  [14-21] 18  (05/14 1410) BP: (106-141)/(62-86) 107/64 mmHg (05/14 1410) SpO2:  [97 %-100 %] 97 % (05/14 1410) Weight change: 0 kg (0 lb) Last BM Date: 09/19/11  Intake/Output from previous day: 05/13 0701 - 05/14 0700 In: 7522.2 [P.O.:1320; I.V.:6202.2] Out: -  Total I/O In: 260 [P.O.:260] Out: -    Physical Exam: General: Alert, awake, oriented x3, in no acute distress. HEENT: No bruits, no goiter. Heart: Regular rate and rhythm, without murmurs, rubs, gallops. Lungs: Clear to auscultation bilaterally. Abdomen: Soft, nontender, nondistended, positive bowel sounds. Extremities: No clubbing cyanosis or edema with positive pedal pulses. Neuro: Grossly intact, nonfocal.    Lab Results: Basic Metabolic Panel:  Basename 09/20/11 0437 09/19/11 0742  NA 139 136  K 3.3* 3.6  CL 107 95*  CO2 26 26  GLUCOSE 96 115*  BUN 14 30*  CREATININE 0.71 0.67  CALCIUM 8.8 10.7*  MG -- --  PHOS -- --   Liver Function Tests:  [facility] 09/20/11 0437 09/19/11 0742  AST 18 40*  ALT 33 58*  ALKPHOS 52 71  BILITOT 0.5 0.7  PROT 6.1 8.4*  ALBUMIN 3.3* 4.8    Basename 09/19/11 0742  LIPASE 19  AMYLASE --   No results found for this basename: AMMONIA:2 in the last 72 hours CBC:  Basename 09/20/11 0437 09/19/11 0742  WBC 6.6 7.9  NEUTROABS -- 5.5  HGB 11.8* 14.8  HCT 35.2* 43.7  MCV 91.9 91.0  PLT 224 220   Cardiac Enzymes: No results found for this basename: CKTOTAL:3,CKMB:3,CKMBINDEX:3,TROPONINI:3 in the last 72 hours BNP: No results found for this basename: PROBNP:3 in the last 72 hours D-Dimer: No results found for this basename: DDIMER:2 in the last 72 hours CBG: No results found for this basename: GLUCAP:6 in the last 72  hours Hemoglobin A1C: No results found for this basename: HGBA1C in the last 72 hours Fasting Lipid Panel: No results found for this basename: CHOL,HDL,LDLCALC,TRIG,CHOLHDL,LDLDIRECT in the last 72 hours Thyroid Function Tests: No results found for this basename: TSH,T4TOTAL,FREET4,T3FREE,THYROIDAB in the last 72 hours Anemia Panel: No results found for this basename: VITAMINB12,FOLATE,FERRITIN,TIBC,IRON,RETICCTPCT in the last 72 hours Coagulation: No results found for this basename: LABPROT:2,INR:2 in the last 72 hours Urine Drug Screen: Drugs of Abuse  No results found for this basename: labopia, cocainscrnur, labbenz, amphetmu, thcu, labbarb    Alcohol Level: No results found for this basename: ETH:2 in the last 72 hours Urinalysis:  Basename 09/19/11 1114  COLORURINE YELLOW  LABSPEC 1.015  PHURINE 6.5  GLUCOSEU NEGATIVE  HGBUR NEGATIVE  BILIRUBINUR NEGATIVE  KETONESUR NEGATIVE  PROTEINUR TRACE*  UROBILINOGEN 0.2  NITRITE POSITIVE*  LEUKOCYTESUR NEGATIVE    No results found for this or any previous visit (from the past 240 hour(s)).  Studies/Results: No results found.  Medications: Scheduled Meds:   . enoxaparin  40 mg Subcutaneous Q24H  . meperidine      . midazolam      . ondansetron (ZOFRAN) IV  4 mg Intravenous Q6H  . pantoprazole (PROTONIX) IV  40 mg Intravenous Q12H  . potassium chloride  40 mEq Oral Once  . promethazine      . sodium chloride      . sodium chloride      .  sodium chloride      . sodium chloride      . sodium chloride      . sucralfate  1 g Oral TID WC & HS   Continuous Infusions:   . sodium chloride 100 mL/hr at 09/21/11 1423   PRN Meds:.acetaminophen, acetaminophen, albuterol, HYDROmorphone (DILAUDID) injection, promethazine, DISCONTD: butamben-tetracaine-benzocaine, DISCONTD: meperidine, DISCONTD: midazolam, DISCONTD: promethazine, DISCONTD: simethicone susp in sterile water 1000 mL irrigation  Assessment/Plan:  Active  Problems:  Epigastric pain  Persistent vomiting  Dehydration  History of systemic lupus erythematosus (SLE)  Plan:  She is on PPI and carafate for esophageal erosions She has continued nausea and epigastric pain with minimal po intake Gastric emptying study planned for the morning Dehydration is better with IVF   LOS: 2 days   Kirk Basquez [facility] Pager: [phone] 09/21/2011, 2:56 PM

## 2011-09-22 ENCOUNTER — Encounter (HOSPITAL_COMMUNITY): Payer: Self-pay

## 2011-09-22 ENCOUNTER — Inpatient Hospital Stay (HOSPITAL_COMMUNITY): Payer: Medicaid Other

## 2011-09-22 DIAGNOSIS — R1013 Epigastric pain: Secondary | ICD-10-CM

## 2011-09-22 DIAGNOSIS — M329 Systemic lupus erythematosus, unspecified: Secondary | ICD-10-CM

## 2011-09-22 DIAGNOSIS — E860 Dehydration: Secondary | ICD-10-CM

## 2011-09-22 DIAGNOSIS — R1115 Cyclical vomiting syndrome unrelated to migraine: Secondary | ICD-10-CM

## 2011-09-22 LAB — HEPATIC FUNCTION PANEL
ALT: 25 U/L (ref 0–35)
AST: 15 U/L (ref 0–37)
Bilirubin, Direct: <0.1 mg/dL (ref 0.0–0.3)
Total Bilirubin: 0.2 mg/dL — ABNORMAL LOW (ref 0.3–1.2)

## 2011-09-22 LAB — DIFFERENTIAL
Basophils Absolute: 0 10*3/uL (ref 0.0–0.1)
Basophils Relative: 0 % (ref 0–1)
Eosinophils Relative: 2 % (ref 0–5)
Monocytes Absolute: 0.3 10*3/uL (ref 0.1–1.0)
Monocytes Relative: 4 % (ref 3–12)
Neutro Abs: 3.7 10*3/uL (ref 1.7–7.7)

## 2011-09-22 LAB — CBC
HCT: 37.2 % (ref 36.0–46.0)
Hemoglobin: 12.8 g/dL (ref 12.0–15.0)
MCH: 31.2 pg (ref 26.0–34.0)
MCHC: 34.4 g/dL (ref 30.0–36.0)
MCV: 90.7 fL (ref 78.0–100.0)
RDW: 12.3 % (ref 11.5–15.5)

## 2011-09-22 MED ORDER — SODIUM CHLORIDE 0.9 % IJ SOLN
INTRAMUSCULAR | Status: AC
  Administered 2011-09-22: 22:00:00
  Filled 2011-09-22: qty 3

## 2011-09-22 MED ORDER — SODIUM CHLORIDE 0.9 % IJ SOLN
INTRAMUSCULAR | Status: AC
  Administered 2011-09-22: 18:00:00
  Filled 2011-09-22: qty 3

## 2011-09-22 MED ORDER — POLYETHYLENE GLYCOL 3350 17 G PO PACK
17.0000 g | PACK | Freq: Every day | ORAL | Status: DC
  Administered 2011-09-22 – 2011-09-24 (×3): 17 g via ORAL
  Filled 2011-09-22 (×3): qty 1

## 2011-09-22 MED ORDER — TECHNETIUM TC 99M SULFUR COLLOID
2.0000 | Freq: Once | INTRAVENOUS | Status: AC | PRN
  Administered 2011-09-22: 2 via ORAL

## 2011-09-22 MED ORDER — SODIUM CHLORIDE 0.9 % IJ SOLN
INTRAMUSCULAR | Status: AC
  Administered 2011-09-22: 10 mL
  Filled 2011-09-22: qty 6

## 2011-09-22 MED ORDER — SODIUM CHLORIDE 0.9 % IJ SOLN
INTRAMUSCULAR | Status: AC
  Administered 2011-09-22
  Filled 2011-09-22: qty 3

## 2011-09-22 MED ORDER — METOCLOPRAMIDE HCL 5 MG/ML IJ SOLN
5.0000 mg | Freq: Four times a day (QID) | INTRAMUSCULAR | Status: AC
  Administered 2011-09-22 – 2011-09-23 (×4): 5 mg via INTRAVENOUS
  Filled 2011-09-22 (×2): qty 2
  Filled 2011-09-22: qty 4
  Filled 2011-09-22: qty 2

## 2011-09-22 MED ORDER — SODIUM CHLORIDE 0.9 % IJ SOLN
INTRAMUSCULAR | Status: AC
  Administered 2011-09-22: 19:00:00
  Filled 2011-09-22: qty 3

## 2011-09-22 MED ORDER — SODIUM CHLORIDE 0.9 % IJ SOLN
INTRAMUSCULAR | Status: AC
  Administered 2011-09-22: 18:00:00
  Filled 2011-09-22: qty 6

## 2011-09-22 MED ORDER — SODIUM CHLORIDE 0.9 % IJ SOLN
INTRAMUSCULAR | Status: AC
  Administered 2011-09-22: 10:00:00
  Filled 2011-09-22: qty 3

## 2011-09-22 MED ORDER — SODIUM CHLORIDE 0.9 % IJ SOLN
INTRAMUSCULAR | Status: AC
  Administered 2011-09-22: 11:00:00
  Filled 2011-09-22: qty 3

## 2011-09-22 NOTE — Progress Notes (Signed)
Subjective: Still having epigastric pain and nausea with any food intake, requiring frequent pain meds and antiemetics  Objective: Vital signs in last 24 hours: Temp:  [97.8 F (36.6 C)-98.2 F (36.8 C)] 98.1 F (36.7 C) (05/15 1500) Pulse Rate:  [70-80] 72  (05/15 1500) Resp:  [16-19] 16  (05/15 1500) BP: (109-116)/(68-73) 109/68 mmHg (05/15 1500) SpO2:  [93 %-98 %] 93 % (05/15 1500) Weight change:  Last BM Date: 09/19/11  Intake/Output from previous day: 05/14 0701 - 05/15 0700 In: 2105 [P.O.:1120; I.V.:975; IV Piggyback:10] Out: -  Total I/O In: 840 [P.O.:840] Out: 750 [Urine:750]   Physical Exam: General: Alert, awake, oriented x3, in no acute distress. HEENT: No bruits, no goiter. Heart: Regular rate and rhythm, without murmurs, rubs, gallops. Lungs: Clear to auscultation bilaterally. Abdomen: Soft, nontender, nondistended, positive bowel sounds. Extremities: No clubbing cyanosis or edema with positive pedal pulses. Neuro: Grossly intact, nonfocal.    Lab Results: Basic Metabolic Panel:  Basename 09/20/11 0437  NA 139  K 3.3*  CL 107  CO2 26  GLUCOSE 96  BUN 14  CREATININE 0.71  CALCIUM 8.8  MG --  PHOS --   Liver Function Tests:  Basename 09/22/11 1113 09/20/11 0437  AST 15 18  ALT 25 33  ALKPHOS 57 52  BILITOT 0.2* 0.5  PROT 6.5 6.1  ALBUMIN 3.4* 3.3*   No results found for this basename: LIPASE:2,AMYLASE:2 in the last 72 hours No results found for this basename: AMMONIA:2 in the last 72 hours CBC:  Basename 09/22/11 1113 09/20/11 0437  WBC 6.0 6.6  NEUTROABS 3.7 --  HGB 12.8 11.8*  HCT 37.2 35.2*  MCV 90.7 91.9  PLT 254 224   Cardiac Enzymes: No results found for this basename: CKTOTAL:3,CKMB:3,CKMBINDEX:3,TROPONINI:3 in the last 72 hours BNP: No results found for this basename: PROBNP:3 in the last 72 hours D-Dimer: No results found for this basename: DDIMER:2 in the last 72 hours CBG: No results found for this basename:  GLUCAP:6 in the last 72 hours Hemoglobin A1C: No results found for this basename: HGBA1C in the last 72 hours Fasting Lipid Panel: No results found for this basename: CHOL,HDL,LDLCALC,TRIG,CHOLHDL,LDLDIRECT in the last 72 hours Thyroid Function Tests: No results found for this basename: TSH,T4TOTAL,FREET4,T3FREE,THYROIDAB in the last 72 hours Anemia Panel: No results found for this basename: VITAMINB12,FOLATE,FERRITIN,TIBC,IRON,RETICCTPCT in the last 72 hours Coagulation: No results found for this basename: LABPROT:2,INR:2 in the last 72 hours Urine Drug Screen: Drugs of Abuse  No results found for this basename: labopia, cocainscrnur, labbenz, amphetmu, thcu, labbarb    Alcohol Level: No results found for this basename: ETH:2 in the last 72 hours Urinalysis: No results found for this basename: COLORURINE:2,APPERANCEUR:2,LABSPEC:2,PHURINE:2,GLUCOSEU:2,HGBUR:2,BILIRUBINUR:2,KETONESUR:2,PROTEINUR:2,UROBILINOGEN:2,NITRITE:2,LEUKOCYTESUR:2 in the last 72 hours  No results found for this or any previous visit (from the past 240 hour(s)).  Studies/Results: Nm Gastric Emptying  09/22/2011  *RADIOLOGY REPORT*  Clinical Data:  Chronic nausea.  NUCLEAR MEDICINE GASTRIC EMPTYING SCAN  Technique:  After oral ingestion of radiolabeled meal, sequential abdominal images were obtained for 120 minutes.  Residual percentage of activity remaining within the stomach was calculated at 60 and 120 minutes.  Radiopharmaceutical:  2.0 mCi Tc-37m sulfur colloid.  Comparison:  None.  Findings: At 60 minutes 92% of the activity remained in the stomach.  At 120 minutes 61% of the activity remained in the stomach.  Normal is less than 30% at 120 minutes.  IMPRESSION: Delayed gastric emptying consistent with gastroparesis.  Original Report Authenticated By: P. Loralie Champagne, M.D.  Medications: Scheduled Meds:   . enoxaparin  40 mg Subcutaneous Q24H  . metoCLOPramide (REGLAN) injection  5 mg Intravenous Q6H  .  ondansetron (ZOFRAN) IV  4 mg Intravenous Q6H  . pantoprazole (PROTONIX) IV  40 mg Intravenous BID AC  . polyethylene glycol  17 g Oral Daily  . sodium chloride      . sodium chloride      . sodium chloride      . sodium chloride      . sodium chloride      . sodium chloride      . sodium chloride      . sucralfate  1 g Oral TID WC & HS  . DISCONTD: pantoprazole (PROTONIX) IV  40 mg Intravenous Q12H   Continuous Infusions:   . sodium chloride 50 mL/hr at 09/21/11 1829   PRN Meds:.acetaminophen, acetaminophen, albuterol, HYDROmorphone (DILAUDID) injection, promethazine, technetium sulfur colloid  Assessment/Plan:  Active Problems:  Epigastric pain  Persistent vomiting  Dehydration  History of systemic lupus erythematosus (SLE)  Plan: This lady was admitted to the hospital with persistent emesis, and dehydration  1. Epigastric pain.  Patient has undergone EGD which showed esophageal erosions, hiatal hernia and some nodules.  She is on BID PPI and carafate.  She has persistent discomfort, pain and nausea with any kind of po intake.  She had a gastric emptying study today which showed delayed emptying.  This was in the setting of narcotics.  Discussed with Dr. Jena Gauss and it was difficult to interpret the results of this study. Plan will be to give trial of reglan and monitor response.  If patient does respond then hopefully she can be discharged home soon.  Dehydration is improved with IVF   LOS: 3 days   Cynara Tatham [facility] Pager: [phone] 09/22/2011, 3:53 PM

## 2011-09-22 NOTE — Progress Notes (Addendum)
Subjective: Patient has continued nausea and epigastric discomfort last night after eating chicken pie & again today after eating eggs for GES. She has not vomited. She has not had a bowel movement in 4 days. Pain is 6/10.  Objective: Vital signs in last 24 hours: Temp:  [97.8 F (36.6 C)-98.2 F (36.8 C)] 97.8 F (36.6 C) (05/15 0520) Pulse Rate:  [70-80] 70  (05/15 0520) Resp:  [16-19] 16  (05/15 0520) BP: (107-116)/(64-73) 116/73 mmHg (05/15 0520) SpO2:  [97 %-98 %] 98 % (05/15 0520) Last BM Date: 09/19/11 General:   Alert,  Well-developed, well-nourished, pleasant and cooperative in NAD. Eyes:  Sclera clear, no icterus.   Conjunctiva pink. Mouth:  No deformity or lesions, oropharynx pink & moist. Heart:  Regular rate and rhythm; no murmurs, clicks, rubs,  or gallops. Abdomen:  Soft, nontender and nondistended. No masses, hepatosplenomegaly or hernias noted. Normal bowel sounds, without guarding, and without rebound.   Msk:  Symmetrical without gross deformities. Pulses:  Normal pulses noted. Extremities:  Without clubbing or edema. Neurologic:  Alert and  oriented x4;  grossly normal neurologically. Skin:  Intact without significant lesions or rashes. Cervical Nodes:  No significant cervical adenopathy. Psych:  Alert and cooperative. Normal mood and affect.  Intake/Output from previous day: 05/14 0701 - 05/15 0700 In: 2105 [P.O.:1120; I.V.:975; IV Piggyback:10] Out: -   Lab Results:  [facility] 09/20/11 0437  WBC 6.6  HGB 11.8*  HCT 35.2*  PLT 224   BMET  Basename 09/20/11 0437  NA 139  K 3.3*  CL 107  CO2 26  GLUCOSE 96  BUN 14  CREATININE 0.71  CALCIUM 8.8   LFT  Basename 09/20/11 0437  PROT 6.1  ALBUMIN 3.3*  AST 18  ALT 33  ALKPHOS 52  BILITOT 0.5  BILIDIR --  IBILI --  LIPASE --  AMYLASE --   Assessment: 1. Erosive reflux esophagitis: On PPI 2. Esophageal nodules: Biopsies pending 3. Nausea: Persistent.  ?gastroparesis. 4. Epigastric pain:  Worse postprandially.  We need to rule out biliary component, possible sphincter of Oddi dysfunction. 5. Constipation  Plan: 1. FU Gastric emptying study 2. Continue twice a day PPI 3. Carafate 4 times a day 4. LFTS & CBC now 5. MiraLax 17 g daily for constipation. Hold if diarrhea.  LOS: 3 days   Lorenza Burton  09/22/2011, 9:38 AM  As above. CBC and LFTs okay. GES review. Marked delay in gastric emptying but has been receiving dilaudid  regularly over the past 24 hours (6 mg total) so delayed gastric emptying is somewhat difficult to interpret in this setting.  Will give her low-dose Reglan i.e. 5 mg IV every 6 hours x4 doses. Risks/benefits of this approach reviewed with the patient. Taper off narcotics recommended. Continue MiraLax. Reassess in the morning. Discussed with Dr. Kerry Hough.

## 2011-09-23 ENCOUNTER — Telehealth: Payer: Self-pay | Admitting: Urgent Care

## 2011-09-23 DIAGNOSIS — R1115 Cyclical vomiting syndrome unrelated to migraine: Secondary | ICD-10-CM

## 2011-09-23 DIAGNOSIS — R1013 Epigastric pain: Secondary | ICD-10-CM

## 2011-09-23 DIAGNOSIS — R112 Nausea with vomiting, unspecified: Secondary | ICD-10-CM

## 2011-09-23 DIAGNOSIS — E860 Dehydration: Secondary | ICD-10-CM

## 2011-09-23 MED ORDER — SODIUM CHLORIDE 0.9 % IJ SOLN
INTRAMUSCULAR | Status: AC
  Administered 2011-09-23: 10 mL
  Filled 2011-09-23: qty 3

## 2011-09-23 MED ORDER — SODIUM CHLORIDE 0.9 % IJ SOLN
INTRAMUSCULAR | Status: AC
  Administered 2011-09-23: 10 mL
  Filled 2011-09-23: qty 6

## 2011-09-23 NOTE — Progress Notes (Signed)
Subjective: This lady has been feeling nauseous but not significant vomiting. She continues to have mostly epigastric and some central abdominal pain. She's tells me that she has had this 4 times in the last year or so and usually it resolves over the few days. This time it has lasted longer. The gastric ending study is abnormal although she has been on opioids.           Physical Exam: Blood pressure 106/73, pulse 72, temperature 98 F (36.7 C), temperature source Oral, resp. rate 16, height 5\' 8"  (1.727 m), weight 68.04 kg (150 lb), SpO2 93.00%. She looks systemically well. She does not appear to be in pain at the present time. Her abdomen is soft and largely nontender. Heart sounds are present and normal. Lung fields are clear.   Investigations:      Basename 09/22/11 1113  AST 15  ALT 25  ALKPHOS 57  BILITOT 0.2*  PROT 6.5  ALBUMIN 3.4*     CBC:  Basename 09/22/11 1113  WBC 6.0  NEUTROABS 3.7  HGB 12.8  HCT 37.2  MCV 90.7  PLT 254    Nm Gastric Emptying  09/22/2011  *RADIOLOGY REPORT*  Clinical Data:  Chronic nausea.  NUCLEAR MEDICINE GASTRIC EMPTYING SCAN  Technique:  After oral ingestion of radiolabeled meal, sequential abdominal images were obtained for 120 minutes.  Residual percentage of activity remaining within the stomach was calculated at 60 and 120 minutes.  Radiopharmaceutical:  2.0 mCi Tc-59m sulfur colloid.  Comparison:  None.  Findings: At 60 minutes 92% of the activity remained in the stomach.  At 120 minutes 61% of the activity remained in the stomach.  Normal is less than 30% at 120 minutes.  IMPRESSION: Delayed gastric emptying consistent with gastroparesis.  Original Report Authenticated By: P. Loralie Champagne, M.D.      Medications:  Scheduled:   . enoxaparin  40 mg Subcutaneous Q24H  . metoCLOPramide (REGLAN) injection  5 mg Intravenous Q6H  . ondansetron (ZOFRAN) IV  4 mg Intravenous Q6H  . pantoprazole (PROTONIX) IV  40 mg Intravenous  BID AC  . polyethylene glycol  17 g Oral Daily  . sodium chloride      . sodium chloride      . sodium chloride      . sodium chloride      . sodium chloride      . sodium chloride      . sodium chloride      . sucralfate  1 g Oral TID WC & HS    Impression: 1. Epigastric pain with nausea and vomiting, unclear etiology. Abnormal gastric emptying study in the face of opioids. 2. History of SLE.     Plan: 1. Continue with dysphagia 3 diet, emphasizing small frequent meals every 2-3 hours. 2. Mobilize.     LOS: 4 days   Wilson Singer Pager [phone]  09/23/2011, 10:37 AM

## 2011-09-23 NOTE — Progress Notes (Signed)
Subjective:  Patient with persistent nausea but no vomiting in 2 days. Last BM on Saturday. C/O achy epigastric pain. 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 Not sure of etiology. Receiving dilaudid 6mg  daily, phenergan 50mg  daily, zofran ATC. Has received two doses of reglan and one dose of miralax.  Objective: Vital signs in last 24 hours: Temp:  [98 F (36.7 C)-98.8 F (37.1 C)] 98 F (36.7 C) (05/16 0508) Pulse Rate:  [72-80] 72  (05/16 0508) Resp:  [16] 16  (05/16 0508) BP: (106-118)/(68-76) 106/73 mmHg (05/16 0508) SpO2:  [93 %-94 %] 93 % (05/16 0508) Last BM Date: 09/19/11 General:   Alert,  Well-developed, well-nourished, pleasant and cooperative in NAD Head:  Normocephalic and atraumatic. Eyes:  Sclera clear, no icterus.   Abdomen:  Soft, mild epig tenderness and nondistended. No masses, hepatosplenomegaly or hernias noted. Normal bowel sounds, without guarding, and without rebound.   Extremities:  Without clubbing, deformity or edema. Neurologic:  Alert and  oriented x4;  grossly normal neurologically. Skin:  Intact without significant lesions or rashes. Psych:  Alert and cooperative. Normal mood and affect.  Intake/Output from previous day: 05/15 0701 - 05/16 0700 In: 1070 [P.O.:1040; I.V.:30] Out: 750 [Urine:750] Intake/Output this shift: Total I/O In: 10 [I.V.:10] Out: -   Lab Results: CBC  Basename 09/22/11 1113  WBC 6.0  HGB 12.8  HCT 37.2  MCV 90.7  PLT 254   LFTs  Basename 09/22/11 1113  BILITOT 0.2*  BILIDIR <0.1  IBILI NOT CALCULATED  ALKPHOS 57  AST 15  ALT 25  PROT 6.5  ALBUMIN 3.4*      Assessment: 1. 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 Nausea-abnormal GES in setting of around the clock narctoics, difficult to interpret 4. Epigastric pain-with h/o pancreatitis X 4. Patient reports h/o abnormal LFTs in past but not sure if at time  of abd pain. Patient reports "stomach" issues for the past year.  5. Constipation  Plan: 1. Continue PPI BID. 2. See how reglan works today. Encourage multiple small meals daily.  3. H/O pancreatitis interesting. Would like to obtain some records. ?spincter of Oddi dysfunction? ?needs EUS? To discuss with Dr. Jena Gauss.    LOS: 4 days   Tana Coast  09/23/2011, 7:59 AM   Nausea and vomiting times one reported by the patient this morning but since has eaten too small meals and has done okay with them so far.  We'll see her back in the office as an outpatient in the coming weeks and decide about further evaluation.

## 2011-09-23 NOTE — Telephone Encounter (Signed)
Please request records from Dr Opal Sidles [facility]) on this pt.   Need all procedure reports with biopsy reports & last office note within past 5 yrs Thanks

## 2011-09-23 NOTE — Telephone Encounter (Signed)
Records Requested.

## 2011-09-24 ENCOUNTER — Encounter: Payer: Self-pay | Admitting: Internal Medicine

## 2011-09-24 ENCOUNTER — Encounter (HOSPITAL_COMMUNITY): Payer: Self-pay | Admitting: Internal Medicine

## 2011-09-24 DIAGNOSIS — M329 Systemic lupus erythematosus, unspecified: Secondary | ICD-10-CM

## 2011-09-24 DIAGNOSIS — R1013 Epigastric pain: Secondary | ICD-10-CM

## 2011-09-24 DIAGNOSIS — E860 Dehydration: Secondary | ICD-10-CM

## 2011-09-24 DIAGNOSIS — R1115 Cyclical vomiting syndrome unrelated to migraine: Secondary | ICD-10-CM

## 2011-09-24 LAB — COMPREHENSIVE METABOLIC PANEL
ALT: 21 U/L (ref 0–35)
AST: 16 U/L (ref 0–37)
Alkaline Phosphatase: 56 U/L (ref 39–117)
Calcium: 9 mg/dL (ref 8.4–10.5)
Potassium: 3.7 mEq/L (ref 3.5–5.1)
Sodium: 141 mEq/L (ref 135–145)
Total Protein: 6.2 g/dL (ref 6.0–8.3)

## 2011-09-24 LAB — CBC
MCH: 31 pg (ref 26.0–34.0)
MCHC: 34 g/dL (ref 30.0–36.0)
Platelets: 257 10*3/uL (ref 150–400)
RBC: 4.16 MIL/uL (ref 3.87–5.11)

## 2011-09-24 MED ORDER — METOCLOPRAMIDE HCL 10 MG PO TABS
10.0000 mg | ORAL_TABLET | Freq: Three times a day (TID) | ORAL | Status: DC
  Administered 2011-09-24 – 2011-09-25 (×4): 10 mg via ORAL
  Filled 2011-09-24 (×4): qty 1

## 2011-09-24 MED ORDER — POLYETHYLENE GLYCOL 3350 17 G PO PACK
17.0000 g | PACK | ORAL | Status: AC
  Administered 2011-09-24 (×6): 17 g via ORAL
  Filled 2011-09-24: qty 2
  Filled 2011-09-24 (×2): qty 1

## 2011-09-24 MED ORDER — PANTOPRAZOLE SODIUM 40 MG PO TBEC
40.0000 mg | DELAYED_RELEASE_TABLET | Freq: Two times a day (BID) | ORAL | Status: DC
  Administered 2011-09-24 – 2011-09-25 (×2): 40 mg via ORAL
  Filled 2011-09-24 (×2): qty 1

## 2011-09-24 MED ORDER — SODIUM CHLORIDE 0.9 % IJ SOLN
INTRAMUSCULAR | Status: AC
  Administered 2011-09-24: 10 mL
  Filled 2011-09-24: qty 3

## 2011-09-24 MED ORDER — POLYETHYLENE GLYCOL 3350 17 G PO PACK
17.0000 g | PACK | Freq: Two times a day (BID) | ORAL | Status: DC
  Administered 2011-09-25: 17 g via ORAL
  Filled 2011-09-24: qty 2
  Filled 2011-09-24: qty 1

## 2011-09-24 MED ORDER — OXYCODONE HCL 5 MG PO TABS
10.0000 mg | ORAL_TABLET | ORAL | Status: DC | PRN
  Administered 2011-09-24 – 2011-09-25 (×5): 10 mg via ORAL
  Filled 2011-09-24 (×5): qty 2

## 2011-09-24 MED ORDER — POLYETHYLENE GLYCOL 3350 17 G PO PACK: 17.0000 g | PACK | Freq: Two times a day (BID) | ORAL | Status: DC

## 2011-09-24 MED ORDER — ONDANSETRON HCL 4 MG/2ML IJ SOLN
4.0000 mg | Freq: Four times a day (QID) | INTRAMUSCULAR | Status: DC | PRN
  Administered 2011-09-24 – 2011-09-25 (×2): 4 mg via INTRAVENOUS
  Filled 2011-09-24 (×2): qty 2

## 2011-09-24 MED ORDER — SODIUM CHLORIDE 0.9 % IJ SOLN
INTRAMUSCULAR | Status: AC
  Filled 2011-09-24: qty 3

## 2011-09-24 MED ORDER — SENNOSIDES-DOCUSATE SODIUM 8.6-50 MG PO TABS
1.0000 | ORAL_TABLET | Freq: Two times a day (BID) | ORAL | Status: DC
  Administered 2011-09-24 (×2): 1 via ORAL
  Filled 2011-09-24 (×3): qty 1

## 2011-09-24 MED ORDER — SODIUM CHLORIDE 0.9 % IJ SOLN
INTRAMUSCULAR | Status: AC
  Administered 2011-09-24: 05:00:00
  Filled 2011-09-24: qty 3

## 2011-09-24 MED ORDER — FLEET ENEMA 7-19 GM/118ML RE ENEM
2.0000 | ENEMA | Freq: Once | RECTAL | Status: AC
  Administered 2011-09-24: 2 via RECTAL

## 2011-09-24 MED ORDER — HYDROMORPHONE HCL PF 1 MG/ML IJ SOLN
1.0000 mg | Freq: Four times a day (QID) | INTRAMUSCULAR | Status: DC | PRN
  Administered 2011-09-24 – 2011-09-25 (×3): 1 mg via INTRAVENOUS
  Filled 2011-09-24 (×3): qty 1

## 2011-09-24 NOTE — Progress Notes (Signed)
Subjective: This lady says she has had further nausea and vomiting. She is keen to have her medications increased for her abdominal pain.           Physical Exam: Blood pressure 105/69, pulse 80, temperature 97.9 F (36.6 C), temperature source Oral, resp. rate 18, height 5\' 8"  (1.727 m), weight 72 kg (158 lb 11.7 oz), SpO2 98.00%. She looks systemically well. She does not appear to be in pain at the present time. Her abdomen is soft and largely nontender. Heart sounds are present and normal. Lung fields are clear.   Investigations:      Basename 09/24/11 0444 09/22/11 1113  AST 16 15  ALT 21 25  ALKPHOS 56 57  BILITOT 0.2* 0.2*  PROT 6.2 6.5  ALBUMIN 3.2* 3.4*     CBC:  Basename 09/24/11 0444 09/22/11 1113  WBC 7.2 6.0  NEUTROABS -- 3.7  HGB 12.9 12.8  HCT 37.9 37.2  MCV 91.1 90.7  PLT 257 254    Nm Gastric Emptying  09/22/2011  *RADIOLOGY REPORT*  Clinical Data:  Chronic nausea.  NUCLEAR MEDICINE GASTRIC EMPTYING SCAN  Technique:  After oral ingestion of radiolabeled meal, sequential abdominal images were obtained for 120 minutes.  Residual percentage of activity remaining within the stomach was calculated at 60 and 120 minutes.  Radiopharmaceutical:  2.0 mCi Tc-64m sulfur colloid.  Comparison:  None.  Findings: At 60 minutes 92% of the activity remained in the stomach.  At 120 minutes 61% of the activity remained in the stomach.  Normal is less than 30% at 120 minutes.  IMPRESSION: Delayed gastric emptying consistent with gastroparesis.  Original Report Authenticated By: P. Loralie Champagne, M.D.      Medications:  Scheduled:    . enoxaparin  40 mg Subcutaneous Q24H  . metoCLOPramide (REGLAN) injection  5 mg Intravenous Q6H  . metoCLOPramide  10 mg Oral TID AC & HS  . ondansetron (ZOFRAN) IV  4 mg Intravenous Q6H  . pantoprazole (PROTONIX) IV  40 mg Intravenous BID AC  . polyethylene glycol  17 g Oral Daily  . senna-docusate  1 tablet Oral BID  . sodium  chloride      . sodium chloride      . sodium chloride      . sodium chloride      . sodium phosphate  2 enema Rectal Once  . sucralfate  1 g Oral TID WC & HS    Impression: 1. Epigastric pain with nausea and vomiting, unclear etiology. Abnormal gastric emptying study in the face of opioids. 2. History of SLE.     Plan: 1. Decrease IV opioids, start oral opioids when necessary. 2. Encourage mobilization. 3. Plan on discharging home tomorrow.     LOS: 5 days   Wilson Singer Pager [phone]  09/24/2011, 10:22 AM

## 2011-09-24 NOTE — Progress Notes (Signed)
The patient is receiving Protonix by the intravenous route.  Based on criteria approved by the Pharmacy and Therapeutics Committee and the Medical Executive Committee, the medication is being converted to the equivalent oral dose form.  These criteria include: -No Active GI bleeding -Able to tolerate diet of full liquids (or better) or tube feeding -Able to tolerate other medications by the oral or enteral route  If you have any questions about this conversion, please contact the Pharmacy Department (ext [phone]).  Thank you.  Andrea Nelson, [facility] 09/24/2011 1:08 PM

## 2011-09-24 NOTE — Progress Notes (Addendum)
REVIEWED.   Tolerating pos. miralxq1h for 6 doses. 1 tap water enema. D/c to home 5/18 on reglan atc and zofran prn.  Dr. Karilyn Cota will round from 5/18-5/20.

## 2011-09-24 NOTE — Progress Notes (Signed)
Brief Nutrition Note  Chart reviewed due to LOS of 5 days. Noted pt with some nausea, but is tolerating her Dysphagia 3 diet well. PO: 50-100%. She is getting small, frequent meals per MD orders. RN in progression reported pt gastric emptying study normal, but MD suspects gastroparesis. Pt will receive enemas today, due to pt not having a bowl movement since 09/18/11. No nutrition recommendations at this time. Continue with current nutrition plan of care.  Melody Haver, RD, LDN Pager: [phone]

## 2011-09-24 NOTE — Plan of Care (Signed)
Problem: Phase III Progression Outcomes Goal: Pain controlled on oral analgesia Outcome: Progressing Patients IV pain med was changed to be further out.  We discussed the importance of using the po pain med verses IV pain medication.  She said she would try to hold out as long as she could.

## 2011-09-24 NOTE — Progress Notes (Signed)
Subjective:  Patient with persistent nausea and vomiting once last night. She continues to complain of epigastric pain 8/10 on pain scale, relieved with water. She's not had a bowel movement in 6 days. She is on around-the-clock Zofran. She is taking MiraLax 17 g daily. She feels bloated. She denies any side effects from Reglan. Objective: Vital signs in last 24 hours: Temp:  [97.9 F (36.6 C)-98.5 F (36.9 C)] 97.9 F (36.6 C) (05/17 0500) Pulse Rate:  [80-88] 80  (05/17 0500) Resp:  [18-20] 18  (05/17 0500) BP: (103-116)/(67-78) 105/69 mmHg (05/17 0500) SpO2:  [96 %-98 %] 98 % (05/16 2101) Weight:  [158 lb 11.7 oz (72 kg)] 158 lb 11.7 oz (72 kg) (05/17 0500) Last BM Date: 09/18/11 General:   Alert,  Well-developed, well-nourished, pleasant and cooperative in NAD Head:  Normocephalic and atraumatic. Eyes:  Sclera clear, no icterus.   Abdomen:  Soft, mild epig tenderness and nondistended. No masses, hepatosplenomegaly or hernias noted. Normal bowel sounds, without guarding, and without rebound.   Extremities:  Without clubbing, deformity or edema. Neurologic:  Alert and  oriented x4;  grossly normal neurologically. Skin:  Intact without significant lesions or rashes. Multiple tattoos Psych:  Alert and cooperative. Normal mood and affect.  Intake/Output from previous day: 05/16 0701 - 05/17 0700 In: 870 [P.O.:840; I.V.:30] Out: 900 [Urine:900] Intake/Output this shift:    Lab Results: CBC  Basename 09/24/11 0444 09/22/11 1113  WBC 7.2 6.0  HGB 12.9 12.8  HCT 37.9 37.2  MCV 91.1 90.7  PLT 257 254   LFTs  Basename 09/24/11 0444 09/22/11 1113  BILITOT 0.2* 0.2*  BILIDIR -- <0.1  IBILI -- NOT CALCULATED  ALKPHOS 56 57  AST 16 15  ALT 21 25  PROT 6.2 6.5  ALBUMIN 3.2* 3.4*      Assessment: 1. 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 Nausea: on low-dose Reglan. Abnormal GES  in setting of around the clock narctoics, difficult to interpret 4. Epigastric pain: with h/o pancreatitis X 4 previously 5. Constipation:No BM in 6 days  Plan: 1. Continue PPI BID. 2. Continue reglan 5mg  QID 3. Fleets enema x2 4. continue MiraLax 17 g daily 5. Continue zofran   LOS: 5 days   Lorenza Burton  09/24/2011, 8:04 AM  addendum: Records reviewed from [facility], Dr. Opal Sidles 02/09/2008 small bowel biopsy benign  11/03/2007 multiple tubular adenomas on colonoscopy removed, mild rectosigmoid erythematous mucosal changes possibly bowel prep-induced, moderate external hemorrhoids 02/06/1999 mild erosive reflux disease, small hiatal hernia on EGD There are no records here regarding pancreatitis  Will request directly from Dr. Gabriela Eves office.

## 2011-09-25 DIAGNOSIS — E860 Dehydration: Secondary | ICD-10-CM

## 2011-09-25 DIAGNOSIS — R1115 Cyclical vomiting syndrome unrelated to migraine: Secondary | ICD-10-CM

## 2011-09-25 DIAGNOSIS — R1013 Epigastric pain: Secondary | ICD-10-CM

## 2011-09-25 LAB — CBC
MCH: 30.7 pg (ref 26.0–34.0)
MCHC: 33.6 g/dL (ref 30.0–36.0)
Platelets: 268 10*3/uL (ref 150–400)
RDW: 12.6 % (ref 11.5–15.5)

## 2011-09-25 LAB — COMPREHENSIVE METABOLIC PANEL
ALT: 22 U/L (ref 0–35)
AST: 15 U/L (ref 0–37)
Albumin: 3.3 g/dL — ABNORMAL LOW (ref 3.5–5.2)
Calcium: 9.1 mg/dL (ref 8.4–10.5)
GFR calc Af Amer: >90 mL/min (ref >90)
Glucose, Bld: 94 mg/dL (ref 70–99)
Potassium: 3.5 mEq/L (ref 3.5–5.1)
Sodium: 142 mEq/L (ref 135–145)
Total Protein: 6.4 g/dL (ref 6.0–8.3)

## 2011-09-25 MED ORDER — OXYCODONE HCL 10 MG PO TABS: 10.0000 mg | ORAL_TABLET | ORAL | Status: AC | PRN

## 2011-09-25 MED ORDER — SENNOSIDES-DOCUSATE SODIUM 8.6-50 MG PO TABS: 1.0000 | ORAL_TABLET | Freq: Two times a day (BID) | ORAL | Status: DC

## 2011-09-25 MED ORDER — METOCLOPRAMIDE HCL 10 MG PO TABS: 10.0000 mg | ORAL_TABLET | Freq: Three times a day (TID) | ORAL | Status: DC

## 2011-09-25 MED ORDER — PANTOPRAZOLE SODIUM 40 MG PO TBEC: 40.0000 mg | DELAYED_RELEASE_TABLET | Freq: Two times a day (BID) | ORAL | Status: DC

## 2011-09-25 MED ORDER — SUCRALFATE 1 GM/10ML PO SUSP: 1.0000 g | Freq: Three times a day (TID) | ORAL | Status: DC

## 2011-09-25 NOTE — Progress Notes (Signed)
Pt. Discharged home via private vehicle, accompanied by family member. Pt. Currently voices no c/o pain or discomfort. Discharge instructions reviewed and received by pt. with good understanding.

## 2011-09-25 NOTE — Discharge Summary (Signed)
Physician Discharge Summary  Patient ID: Andrea Nelson MRN: [id number] DOB/AGE: 06/22/1965 46 y.o. Primary Care Physician:FUSCO,LAWRENCE J., MD, MD Admit date: 09/19/2011 Discharge date: 09/25/2011    Discharge Diagnoses:  1. Epigastric pain, nausea and vomiting, unclear etiology. EGD showed distal esophageal erosions and mild reflux. Biopsies have been taken. Gastric emptying study abnormal, in the presence of opioids. 2. Dehydration, resolved. 3. SLE, stable.   Medication List  As of 09/25/2011  8:53 AM   STOP taking these medications         oxyCODONE-acetaminophen 5-325 MG per tablet         TAKE these medications         calcium carbonate 500 MG chewable tablet   Commonly known as: TUMS - dosed in mg elemental calcium   Chew 2 tablets by mouth daily as needed. indigestion      lidocaine 5 %   Commonly known as: LIDODERM   Place 1 patch onto the skin daily as needed. For hip pain  Remove & Discard patch within 12 hours or as directed by MD      metoCLOPramide 10 MG tablet   Commonly known as: REGLAN   Take 1 tablet (10 mg total) by mouth 4 (four) times daily -  before meals and at bedtime.      Oxycodone HCl 10 MG Tabs   Take 1 tablet (10 mg total) by mouth every 4 (four) hours as needed.      pantoprazole 40 MG tablet   Commonly known as: PROTONIX   Take 1 tablet (40 mg total) by mouth 2 (two) times daily before a meal.      promethazine 25 MG tablet   Commonly known as: PHENERGAN   Take 1 tablet (25 mg total) by mouth every 6 (six) hours as needed for nausea.      senna-docusate 8.6-50 MG per tablet   Commonly known as: Senokot-S   Take 1 tablet by mouth 2 (two) times daily.      sucralfate 1 GM/10ML suspension   Commonly known as: CARAFATE   Take 10 mLs (1 g total) by mouth 4 (four) times daily -  with meals and at bedtime.      VENTOLIN HFA 108 (90 BASE) MCG/ACT inhaler   Generic drug: albuterol   Inhale 2 puffs into the lungs every 6 (six) hours as  needed. For shortness of breath            Discharged Condition: Stable.    Consults: Gastroenterology, Dr. Kendell Bane.  Significant Diagnostic Studies: Ct Abdomen Pelvis Wo Contrast  09/19/2011  *RADIOLOGY REPORT*  Clinical Data: Upper abdominal pain, history of uterine cancer, prior appendectomy, prior hysterectomy, prior cholecystectomy  CT ABDOMEN AND PELVIS WITHOUT CONTRAST  Technique:  Multidetector CT imaging of the abdomen and pelvis was performed following the standard protocol without intravenous contrast.  Comparison: 05/16/2011  Findings: Lung bases are unremarkable.  Small amount of contrast is noted in distal esophagus.  Nonspecific mild thickening of distal esophageal wall.  This may be due to gastroesophageal reflux disease.  Clinical correlation is necessary.  Heart size is within normal limits.  Study is limited without IV contrast.  Unenhanced liver shows no biliary ductal dilatation. The patient is status post cholecystectomy.  Unenhanced spleen, pancreas and adrenal glands are unremarkable.  Small accessory splenule is noted.  Unenhanced kidneys are symmetrical in size.  There is no nephrolithiasis.  No hydronephrosis or hydroureter.  Mild atherosclerotic calcifications distal abdominal aorta and  the iliac arteries.  No aortic aneurysm.  Oral contrast material was given to the patient.  There is no small bowel or colonic obstruction.  No ascites or free air.  No adenopathy.  No pericecal inflammation.  Limited assessment of the transverse and proximal left colon which is empty collapsed non opacified with contrast.  Some stool noted in the left colon.  The appendix is surgically absent.  There is a low-lying cecum with tip in the right anterior pelvis just above the urinary bladder.  The uterus is surgically absent. Bowel visualized unenhanced ovary is unremarkable.  Small amount of pelvic free fluid noted in the posterior pelvis.  No distal colonic obstruction.  No destructive bony  lesions are noted within pelvis. The urinary bladder is unremarkable.  Sagittal images of the spine are unremarkable.  IMPRESSION:  1.  No acute inflammatory process within abdomen or pelvis.  There is a low-lying cecum.  No pericecal inflammation.  Status post appendectomy. 2.  Status post cholecystectomy.  4.  No small bowel obstruction.  No ascites or free air. 5.  No hydronephrosis or hydroureter. 6.  Surgically absent uterus.  Original Report Authenticated By: Natasha Mead, M.D.   Dg Chest 2 View  09/07/2011  *RADIOLOGY REPORT*  Clinical Data: Chest pain  CHEST - 2 VIEW  Comparison: None.  Findings: Lungs are clear. No pleural effusion or pneumothorax.  Cardiomediastinal silhouette is within normal limits.  Mild degenerative changes of the visualized thoracolumbar spine.  IMPRESSION: Normal chest radiographs.  Original Report Authenticated By: Charline Bills, M.D.   Nm Gastric Emptying  09/22/2011  *RADIOLOGY REPORT*  Clinical Data:  Chronic nausea.  NUCLEAR MEDICINE GASTRIC EMPTYING SCAN  Technique:  After oral ingestion of radiolabeled meal, sequential abdominal images were obtained for 120 minutes.  Residual percentage of activity remaining within the stomach was calculated at 60 and 120 minutes.  Radiopharmaceutical:  2.0 mCi Tc-88m sulfur colloid.  Comparison:  None.  Findings: At 60 minutes 92% of the activity remained in the stomach.  At 120 minutes 61% of the activity remained in the stomach.  Normal is less than 30% at 120 minutes.  IMPRESSION: Delayed gastric emptying consistent with gastroparesis.  Original Report Authenticated By: P. Loralie Champagne, M.D.   Dg Abd Acute W/chest  09/17/2011  *RADIOLOGY REPORT*  Clinical Data: Upper abdominal pain  ACUTE ABDOMEN SERIES (ABDOMEN 2 VIEW & CHEST 1 VIEW)  Comparison: 09/07/2011 chest x-ray  Findings: Normal heart.  Clear lungs  No disproportionate dilatation of bowel.  No free intraperitoneal gas.  Postoperative changes in the right side of the  abdomen.  IMPRESSION: Nonobstructive bowel gas pattern.  Original Report Authenticated By: Donavan Burnet, M.D.    Lab Results: Basic Metabolic Panel:  Basename 09/25/11 0559 09/24/11 0444  NA 142 141  K 3.5 3.7  CL 106 105  CO2 27 28  GLUCOSE 94 101*  BUN 10 12  CREATININE 0.61 0.73  CALCIUM 9.1 9.0  MG -- --  PHOS -- --   Liver Function Tests:  Basename 09/25/11 0559 09/24/11 0444  AST 15 16  ALT 22 21  ALKPHOS 56 56  BILITOT 0.2* 0.2*  PROT 6.4 6.2  ALBUMIN 3.3* 3.2*     CBC:  Basename 09/25/11 0559 09/24/11 0444 09/22/11 1113  WBC 7.6 7.2 --  NEUTROABS -- -- 3.7  HGB 12.0 12.9 --  HCT 35.7* 37.9 --  MCV 91.3 91.1 --  PLT 268 257 --  Hospital Course: This 46 year old lady was admitted with symptoms of vomiting and epigastric pain. Please see initial history and physical examination. She reports that she has had similar episodes previously when it was due to pancreatitis. We do not have any evidence of this. She was therefore admitted, on this occasion lipase was normal and clinically she did not really have pancreatitis. She was given intravenous fluids as she was dehydrated and also intravenous opioids. She was put on proton pump inhibitors. She had upper GI endoscopy the findings are listed above. She was therefore advised to be on twice daily Protonix and also Carafate. She had a gastric emptying study which was abnormal. She was started on metoclopramide before meals. She still continues to have some pain that she is able to tolerate food without vomiting now. She's been given medicines to open her bowels and she had will do so I'm sure soon. During hospitalization, there seemed to be somewhat of an overuse of opioids.  Discharge Exam: Blood pressure 110/72, pulse 84, temperature 98.3 F (36.8 C), temperature source Oral, resp. rate 18, height 5\' 8"  (1.727 m), weight 72 kg (158 lb 11.7 oz), SpO2 96.00%. She looks systemically well. She does not appear to  be any significant pain. Heart sounds are present and normal. Lung fields are clear. Abdomen is soft and subjective a tender in the epigastric area especially. She is alert and orientated without any focal signs.  Disposition: Home. Metoclopramide before meals and at bedtime will be useful. She's been given a small supply of oxycodone. I've asked her to follow with Dr. Darrick Penna who last saw her in the hospital from the gastroenterologist.  Discharge Orders    Future Orders Please Complete By Expires   Diet - low sodium heart healthy      Increase activity slowly           SignedWilson Nelson Pager [phone]  09/25/2011, 8:53 AM

## 2011-12-04 ENCOUNTER — Encounter (HOSPITAL_COMMUNITY): Payer: Self-pay

## 2011-12-04 ENCOUNTER — Emergency Department (HOSPITAL_COMMUNITY)
Admission: EM | Admit: 2011-12-04 | Discharge: 2011-12-04 | Disposition: A | Discharge status: Home / Self Care | Payer: Medicaid Other | Attending: Emergency Medicine | Admitting: Emergency Medicine

## 2011-12-04 DIAGNOSIS — R1013 Epigastric pain: Secondary | ICD-10-CM | POA: Insufficient documentation

## 2011-12-04 DIAGNOSIS — N390 Urinary tract infection, site not specified: Secondary | ICD-10-CM | POA: Insufficient documentation

## 2011-12-04 DIAGNOSIS — R109 Unspecified abdominal pain: Secondary | ICD-10-CM | POA: Insufficient documentation

## 2011-12-04 DIAGNOSIS — R112 Nausea with vomiting, unspecified: Secondary | ICD-10-CM | POA: Insufficient documentation

## 2011-12-04 LAB — CBC WITH DIFFERENTIAL/PLATELET
Eosinophils Relative: 1 % (ref 0–5)
Hemoglobin: 14.6 g/dL (ref 12.0–15.0)
Lymphocytes Relative: 30 % (ref 12–46)
Lymphs Abs: 2.1 10*3/uL (ref 0.7–4.0)
MCV: 91.2 fL (ref 78.0–100.0)
Monocytes Relative: 5 % (ref 3–12)
Platelets: 252 10*3/uL (ref 150–400)
RBC: 4.65 MIL/uL (ref 3.87–5.11)
WBC: 7 10*3/uL (ref 4.0–10.5)

## 2011-12-04 LAB — URINE MICROSCOPIC-ADD ON

## 2011-12-04 LAB — URINALYSIS, ROUTINE W REFLEX MICROSCOPIC
Bilirubin Urine: NEGATIVE
Hgb urine dipstick: NEGATIVE
Protein, ur: NEGATIVE mg/dL
Urobilinogen, UA: 0.2 mg/dL (ref 0.0–1.0)

## 2011-12-04 LAB — COMPREHENSIVE METABOLIC PANEL
ALT: 28 U/L (ref 0–35)
Alkaline Phosphatase: 78 U/L (ref 39–117)
CO2: 25 mEq/L (ref 19–32)
Calcium: 10.2 mg/dL (ref 8.4–10.5)
GFR calc Af Amer: >90 mL/min (ref >90)
GFR calc non Af Amer: >90 mL/min (ref >90)
Glucose, Bld: 100 mg/dL — ABNORMAL HIGH (ref 70–99)
Potassium: 4.1 mEq/L (ref 3.5–5.1)
Sodium: 134 mEq/L — ABNORMAL LOW (ref 135–145)

## 2011-12-04 MED ORDER — PANTOPRAZOLE SODIUM 40 MG IV SOLR
40.0000 mg | Freq: Once | INTRAVENOUS | Status: AC
  Administered 2011-12-04: 40 mg via INTRAVENOUS
  Filled 2011-12-04: qty 40

## 2011-12-04 MED ORDER — ONDANSETRON HCL 4 MG/2ML IJ SOLN
4.0000 mg | Freq: Once | INTRAMUSCULAR | Status: AC
Start: 2011-12-04 — End: 2011-12-04
  Administered 2011-12-04: 4 mg via INTRAVENOUS
  Filled 2011-12-04: qty 2

## 2011-12-04 MED ORDER — SULFAMETHOXAZOLE-TRIMETHOPRIM 800-160 MG PO TABS: 1.0000 | ORAL_TABLET | Freq: Two times a day (BID) | ORAL | Status: AC

## 2011-12-04 MED ORDER — METOCLOPRAMIDE HCL 10 MG PO TABS: 10.0000 mg | ORAL_TABLET | Freq: Four times a day (QID) | ORAL | Status: DC

## 2011-12-04 MED ORDER — OXYCODONE-ACETAMINOPHEN 5-325 MG PO TABS: 2.0000 | ORAL_TABLET | ORAL | Status: AC | PRN

## 2011-12-04 MED ORDER — SODIUM CHLORIDE 0.9 % IV BOLUS (SEPSIS)
1000.0000 mL | Freq: Once | INTRAVENOUS | Status: AC
  Administered 2011-12-04: 1000 mL via INTRAVENOUS

## 2011-12-04 MED ORDER — HYDROMORPHONE HCL PF 1 MG/ML IJ SOLN
1.0000 mg | Freq: Once | INTRAMUSCULAR | Status: AC
  Administered 2011-12-04: 1 mg via INTRAVENOUS
  Filled 2011-12-04: qty 1

## 2011-12-04 MED ORDER — METOCLOPRAMIDE HCL 5 MG/ML IJ SOLN
10.0000 mg | Freq: Once | INTRAMUSCULAR | Status: AC
  Administered 2011-12-04: 10 mg via INTRAVENOUS
  Filled 2011-12-04: qty 2

## 2011-12-04 MED ORDER — GI COCKTAIL ~~LOC~~
30.0000 mL | Freq: Once | ORAL | Status: AC
  Administered 2011-12-04: 30 mL via ORAL
  Filled 2011-12-04: qty 30

## 2011-12-04 MED ORDER — ONDANSETRON HCL 4 MG/2ML IJ SOLN
4.0000 mg | Freq: Once | INTRAMUSCULAR | Status: AC
  Administered 2011-12-04: 4 mg via INTRAVENOUS
  Filled 2011-12-04: qty 2

## 2011-12-04 NOTE — ED Notes (Signed)
Discharge instructions reviewed with pt, questions answered. Pt verbalized understanding.  

## 2011-12-04 NOTE — ED Provider Notes (Signed)
History    This chart was scribed for Jafari Mckillop B. Bernette Mayers, MD, MD by Smitty Pluck. The patient was seen in room APA04 and the patient's care was started at 3:34PM.   CSN: [id number]  Arrival date & time 12/04/11  1459   First MD Initiated Contact with Patient 12/04/11 1530      Chief Complaint  Patient presents with  . Abdominal Pain    (Consider location/radiation/quality/duration/timing/severity/associated sxs/prior treatment) The history is provided by the patient.   Andrea Nelson is a 46 y.o. female who presents to the Emergency Department complaining of intermittent moderate upper abdominal pain onset 2 days ago. Pt reports that pain is aggravated by eating. Pt reports having nausea  and vomiting. Denies fever, hematemesis and hematochezia. Pt had pancreatitis, gastroparesis and was admitted to hospital in April 2013 for 8 days. Pt reports hx of hiatal hernia. There is no radiation of pain.     Past Medical History  Diagnosis Date  . Colitis 11/2010    ?  [name] Lupus   . DDD (degenerative disc disease)   . Pancreatitis 2012    [name]  . Uterine cancer 1996  . Adenomatous polyp of colon 02/2010  . PUD (peptic ulcer disease)     Past Surgical History  Procedure Date  . Abdominal hysterectomy 1996    w/ right salpingoopherectomy  . Appendectomy 1977  . Cholecystectomy 1987  . Cesarean section 1987  . Abdominal exploration surgery 1996    fibroid  . Colonoscopy w/ biopsies 02/2010    Dr Opal Sidles, [facility]  . Esophagogastroduodenoscopy 02/2010    Dr Lamar Laundry  . Shoulder surgery   . Esophagogastroduodenoscopy 09/21/11    [place]-mild erosive esophagitis, small hiatal hernia, esophageal nodules  . Esophagogastroduodenoscopy 09/20/2011    Procedure: ESOPHAGOGASTRODUODENOSCOPY (EGD);  Surgeon: Corbin Ade, MD;  Location: [facility];  Service: Endoscopy;  Laterality: N/A;    Family History  Problem Relation Age of Onset  . Ulcers Mother   . Colon cancer Maternal  Grandmother     History  Substance Use Topics  . Smoking status: Current Everyday Smoker -- 0.5 packs/day for 25 years    Types: Cigarettes  . Smokeless tobacco: Not on file  . Alcohol Use: No    OB History    Grav Para Term Preterm Abortions TAB SAB Ect Mult Living                  Review of Systems  All other systems reviewed and are negative.  10 Systems reviewed and all are negative for acute change except as noted in the HPI.    Allergies  Honey bee venom; Ibuprofen; Ketorolac tromethamine; and Penicillins  Home Medications   Current Outpatient Rx  Name Route Sig Dispense Refill  . ALBUTEROL SULFATE HFA 108 (90 BASE) MCG/ACT IN AERS Inhalation Inhale 2 puffs into the lungs every 6 (six) hours as needed. For shortness of breath     . CALCIUM CARBONATE ANTACID 500 MG PO CHEW Oral Chew 2 tablets by mouth daily as needed. indigestion    . LIDOCAINE 5 % EX PTCH Transdermal Place 1 patch onto the skin daily as needed. For hip pain Remove & Discard patch within 12 hours or as directed by MD    . METOCLOPRAMIDE HCL 10 MG PO TABS Oral Take 1 tablet (10 mg total) by mouth 4 (four) times daily -  before meals and at bedtime. 120 tablet 0  . PANTOPRAZOLE SODIUM 40 MG PO  TBEC Oral Take 1 tablet (40 mg total) by mouth 2 (two) times daily before a meal. 60 tablet 0  . PROMETHAZINE HCL 25 MG PO TABS Oral Take 1 tablet (25 mg total) by mouth every 6 (six) hours as needed for nausea. 20 tablet 0  . SENNOSIDES-DOCUSATE SODIUM 8.6-50 MG PO TABS Oral Take 1 tablet by mouth 2 (two) times daily. 30 tablet 0  . SUCRALFATE 1 GM/10ML PO SUSP Oral Take 10 mLs (1 g total) by mouth 4 (four) times daily -  with meals and at bedtime. 420 mL 0    BP 120/94  Pulse 96  Temp 98.4 F (36.9 C) (Oral)  Resp 20  Ht 5\' 8"  (1.727 m)  Wt 150 lb (68.04 kg)  BMI 22.81 kg/m2  SpO2 100%  Physical Exam  Nursing note and vitals reviewed. Constitutional: She is oriented to person, place, and time. She  appears well-developed and well-nourished.  HENT:  Head: Normocephalic and atraumatic.  Eyes: EOM are normal. Pupils are equal, round, and reactive to light.  Neck: Normal range of motion. Neck supple.  Cardiovascular: Normal rate, normal heart sounds and intact distal pulses.   Pulmonary/Chest: Effort normal and breath sounds normal.  Abdominal: Bowel sounds are normal. She exhibits no distension. There is tenderness (epigastric). There is no rebound and no guarding.  Musculoskeletal: Normal range of motion. She exhibits no edema and no tenderness.  Neurological: She is alert and oriented to person, place, and time. She has normal strength. No cranial nerve deficit or sensory deficit.  Skin: Skin is warm and dry. No rash noted.  Psychiatric: She has a normal mood and affect.    ED Course  Procedures (including critical care time) DIAGNOSTIC STUDIES: Oxygen Saturation is 100% on room air, normal by my interpretation.    COORDINATION OF CARE: 3:38PM EDP discusses pt ED treatment with pt  3:40PM EDP ordered medication:  Scheduled Meds:   .  HYDROmorphone (DILAUDID) injection  1 mg Intravenous Once  . ondansetron  4 mg Intravenous Once  . sodium chloride  1,000 mL Intravenous Once   Continuous Infusions:  PRN Meds:.    Labs Reviewed - No data to display No results found.   No diagnosis found.    MDM   Date: 12/04/2011  Rate: 80  Rhythm: normal sinus rhythm  QRS Axis: normal  Intervals: normal  ST/T Wave abnormalities: normal  Conduction Disutrbances: none  Narrative Interpretation: unremarkable   4:55 PM Reviewed notes from admission in Apr 2013 makes mention of prior pancreatitis but no evidence of same on that admission. She had delayed gastric emptying, started on PPI and Reglan.    I personally performed the services described in the documentation, which were scribed in my presence. The recorded information has been reviewed and considered.     6:08  PM Pain improved. UA consistent with UTI otherwise labs unremarkable. Will d/c with antibiotics, pain meds, antiemetics and GI followup if symptoms persist.    Leani Myron B. Bernette Mayers, MD 12/04/11 1324

## 2011-12-04 NOTE — ED Notes (Signed)
Pt reports mid-sternal cp off/on since thursday, sob at times, "funny feeling in my chest", also has been treated recently for hiatal hernia, +vomiting.

## 2011-12-04 NOTE — ED Notes (Signed)
Patient tried to use a bedpan but was unable to at this time.

## 2012-01-27 ENCOUNTER — Other Ambulatory Visit (HOSPITAL_COMMUNITY): Payer: Self-pay | Admitting: Oral Surgery

## 2012-02-10 ENCOUNTER — Emergency Department (HOSPITAL_COMMUNITY)
Admission: EM | Admit: 2012-02-10 | Discharge: 2012-02-10 | Disposition: A | Discharge status: Home / Self Care | Payer: Medicaid Other | Attending: Emergency Medicine | Admitting: Emergency Medicine

## 2012-02-10 ENCOUNTER — Other Ambulatory Visit: Payer: Self-pay

## 2012-02-10 ENCOUNTER — Encounter (HOSPITAL_COMMUNITY): Payer: Self-pay | Admitting: Emergency Medicine

## 2012-02-10 DIAGNOSIS — R1013 Epigastric pain: Secondary | ICD-10-CM | POA: Insufficient documentation

## 2012-02-10 DIAGNOSIS — R112 Nausea with vomiting, unspecified: Secondary | ICD-10-CM | POA: Insufficient documentation

## 2012-02-10 DIAGNOSIS — R142 Eructation: Secondary | ICD-10-CM | POA: Insufficient documentation

## 2012-02-10 DIAGNOSIS — M329 Systemic lupus erythematosus, unspecified: Secondary | ICD-10-CM | POA: Insufficient documentation

## 2012-02-10 DIAGNOSIS — Z8711 Personal history of peptic ulcer disease: Secondary | ICD-10-CM | POA: Insufficient documentation

## 2012-02-10 DIAGNOSIS — R143 Flatulence: Secondary | ICD-10-CM | POA: Insufficient documentation

## 2012-02-10 DIAGNOSIS — R141 Gas pain: Secondary | ICD-10-CM | POA: Insufficient documentation

## 2012-02-10 DIAGNOSIS — Z79899 Other long term (current) drug therapy: Secondary | ICD-10-CM | POA: Insufficient documentation

## 2012-02-10 DIAGNOSIS — Z8542 Personal history of malignant neoplasm of other parts of uterus: Secondary | ICD-10-CM | POA: Insufficient documentation

## 2012-02-10 LAB — COMPREHENSIVE METABOLIC PANEL
BUN: 10 mg/dL (ref 6–23)
CO2: 26 mEq/L (ref 19–32)
Calcium: 9 mg/dL (ref 8.4–10.5)
Chloride: 108 mEq/L (ref 96–112)
Creatinine, Ser: 0.62 mg/dL (ref 0.50–1.10)
GFR calc Af Amer: >90 mL/min (ref >90)
GFR calc non Af Amer: >90 mL/min (ref >90)
Total Bilirubin: 0.3 mg/dL (ref 0.3–1.2)

## 2012-02-10 LAB — CBC WITH DIFFERENTIAL/PLATELET
Eosinophils Relative: 7 % — ABNORMAL HIGH (ref 0–5)
HCT: 41.2 % (ref 36.0–46.0)
Hemoglobin: 13.9 g/dL (ref 12.0–15.0)
Lymphocytes Relative: 44 % (ref 12–46)
MCHC: 33.7 g/dL (ref 30.0–36.0)
MCV: 93.6 fL (ref 78.0–100.0)
Monocytes Absolute: 0.4 10*3/uL (ref 0.1–1.0)
Monocytes Relative: 7 % (ref 3–12)
Neutro Abs: 2 10*3/uL (ref 1.7–7.7)
RDW: 12.6 % (ref 11.5–15.5)
WBC: 4.8 10*3/uL (ref 4.0–10.5)

## 2012-02-10 LAB — LIPASE, BLOOD: Lipase: 21 U/L (ref 11–59)

## 2012-02-10 MED ORDER — SODIUM CHLORIDE 0.9 % IV SOLN
1000.0000 mL | INTRAVENOUS | Status: DC
  Administered 2012-02-10: 1000 mL via INTRAVENOUS

## 2012-02-10 MED ORDER — PROMETHAZINE HCL 25 MG RE SUPP: RECTAL | Status: DC

## 2012-02-10 MED ORDER — DIPHENHYDRAMINE HCL 50 MG/ML IJ SOLN
50.0000 mg | Freq: Once | INTRAMUSCULAR | Status: AC
  Administered 2012-02-10: 50 mg via INTRAVENOUS
  Filled 2012-02-10: qty 1

## 2012-02-10 MED ORDER — FENTANYL CITRATE 0.05 MG/ML IJ SOLN
50.0000 ug | Freq: Once | INTRAMUSCULAR | Status: AC
  Administered 2012-02-10: 50 ug via INTRAVENOUS
  Filled 2012-02-10: qty 2

## 2012-02-10 MED ORDER — METOCLOPRAMIDE HCL 5 MG/ML IJ SOLN
10.0000 mg | Freq: Once | INTRAMUSCULAR | Status: AC
  Administered 2012-02-10: 10 mg via INTRAVENOUS
  Filled 2012-02-10: qty 2

## 2012-02-10 MED ORDER — PANTOPRAZOLE SODIUM 40 MG IV SOLR
40.0000 mg | Freq: Once | INTRAVENOUS | Status: AC
  Administered 2012-02-10: 40 mg via INTRAVENOUS
  Filled 2012-02-10: qty 40

## 2012-02-10 MED ORDER — SODIUM CHLORIDE 0.9 % IV SOLN
1000.0000 mL | Freq: Once | INTRAVENOUS | Status: AC
  Administered 2012-02-10: 1000 mL via INTRAVENOUS

## 2012-02-10 NOTE — ED Notes (Signed)
Pt reporting improvement in overall pain level, requesting IV removed.  DC'd intact.

## 2012-02-10 NOTE — ED Provider Notes (Signed)
History     CSN: [id number]  Arrival date & time 02/10/12  9562   First MD Initiated Contact with Patient 02/10/12 [phone]      Chief Complaint  Patient presents with  . Abdominal Pain    (Consider location/radiation/quality/duration/timing/severity/associated sxs/prior treatment) HPI  Patient reports she started getting abdominal pain on October 1. She states the pain is in her epigastric and left upper quadrant and she feels bloated. She states the pain is sharp without burning. She has nausea and  vomiting without diarrhea. She denies fever. She states she's not slept for the past 2 days. She states nothing she does makes it feel worse, nothing she does makes it feel better. She states she's followed by Dr.Rourk gastroenterologist. She states she was just in the hospital recently for pancreatitis. She states this feels like her prior pancreatitis.  PCP Dr Sherril Croon in [facility] Dr Jena Gauss  Past Medical History  Diagnosis Date  . Colitis 11/2010    ?  [name] Lupus   . DDD (degenerative disc disease)   . Pancreatitis 2012    [name]  . Uterine cancer 1996  . Adenomatous polyp of colon 02/2010  . PUD (peptic ulcer disease)     Past Surgical History  Procedure Date  . Abdominal hysterectomy 1996    w/ right salpingoopherectomy  . Appendectomy 1977  . Cholecystectomy 1987  . Cesarean section 1987  . Abdominal exploration surgery 1996    fibroid  . Colonoscopy w/ biopsies 02/2010    Dr Opal Sidles, [facility] Lan  . Esophagogastroduodenoscopy 02/2010    Dr Lamar Laundry  . Shoulder surgery   . Esophagogastroduodenoscopy 09/21/11    Rourk-mild erosive esophagitis, small hiatal hernia, esophageal nodules  . Esophagogastroduodenoscopy 09/20/2011    Procedure: ESOPHAGOGASTRODUODENOSCOPY (EGD);  Surgeon: Corbin Ade, MD;  Location: [facility];  Service: Endoscopy;  Laterality: N/A;    Family History  Problem Relation Age of Onset  . Ulcers Mother   . Colon cancer Maternal Grandmother      History  Substance Use Topics  . Smoking status: Current Every Day Smoker -- 0.5 packs/day for 25 years    Types: Cigarettes  . Smokeless tobacco: Not on file  . Alcohol Use: No   unemployed  OB History    Grav Para Term Preterm Abortions TAB SAB Ect Mult Living                  Review of Systems  All other systems reviewed and are negative.    Allergies  Honey bee venom; Ibuprofen; Ketorolac tromethamine; and Penicillins  Home Medications   Current Outpatient Rx  Name Route Sig Dispense Refill  . METOCLOPRAMIDE HCL 10 MG PO TABS Oral Take 1 tablet (10 mg total) by mouth 4 (four) times daily -  before meals and at bedtime. 120 tablet 0  . METOCLOPRAMIDE HCL 10 MG PO TABS Oral Take 10 mg by mouth 4 (four) times daily. And at bedtime    . METOCLOPRAMIDE HCL 10 MG PO TABS Oral Take 1 tablet (10 mg total) by mouth every 6 (six) hours. 30 tablet 0  . PROMETHAZINE HCL 25 MG PO TABS Oral Take 1 tablet (25 mg total) by mouth every 6 (six) hours as needed for nausea. 20 tablet 0  . SUCRALFATE 1 GM/10ML PO SUSP Oral Take 10 mLs (1 g total) by mouth 4 (four) times daily -  with meals and at bedtime. 420 mL 0  prilosec OTC  BP 114/73  Pulse  102  Temp 97.8 F (36.6 C) (Oral)  Resp 16  Ht 5\' 8"  (1.727 m)  Wt 150 lb (68.04 kg)  BMI 22.81 kg/m2  SpO2 99%  Vital signs normal except tachycardia   Physical Exam  Nursing note and vitals reviewed. Constitutional: She is oriented to person, place, and time. She appears well-developed and well-nourished.  Non-toxic appearance. She does not appear ill. No distress.  HENT:  Head: Normocephalic and atraumatic.  Right Ear: External ear normal.  Left Ear: External ear normal.  Nose: Nose normal. No mucosal edema or rhinorrhea.  Mouth/Throat: Mucous membranes are normal. No dental abscesses or uvula swelling.       Poor dentition, tongue dry  Eyes: Conjunctivae normal and EOM are normal. Pupils are equal, round, and reactive to  light.  Neck: Normal range of motion and full passive range of motion without pain. Neck supple.  Cardiovascular: Normal rate, regular rhythm and normal heart sounds.  Exam reveals no gallop and no friction rub.   No murmur heard. Pulmonary/Chest: Effort normal and breath sounds normal. No respiratory distress. She has no wheezes. She has no rhonchi. She has no rales. She exhibits no tenderness and no crepitus.  Abdominal: Soft. Normal appearance and bowel sounds are normal. She exhibits no distension. There is tenderness. There is no rebound and no guarding.       Mild epigastric and left upper quadrant pain to palpation  Musculoskeletal: Normal range of motion. She exhibits no edema and no tenderness.       Moves all extremities well.   Neurological: She is alert and oriented to person, place, and time. She has normal strength. No cranial nerve deficit.  Skin: Skin is warm, dry and intact. No rash noted. No erythema. No pallor.  Psychiatric: She has a normal mood and affect. Her speech is normal and behavior is normal. Her mood appears not anxious.    ED Course  Procedures (including critical care time)   Medications  0.9 %  sodium chloride infusion (0 mL Intravenous Stopped 02/10/12 0526)    Followed by  0.9 %  sodium chloride infusion (1000 mL Intravenous New Bag/Given 02/10/12 0433)  promethazine (PHENERGAN) 25 MG suppository (not administered)  pantoprazole (PROTONIX) injection 40 mg (40 mg Intravenous Given 02/10/12 0357)  metoCLOPramide (REGLAN) injection 10 mg (10 mg Intravenous Given 02/10/12 0357)  diphenhydrAMINE (BENADRYL) injection 50 mg (50 mg Intravenous Given 02/10/12 0358)  fentaNYL (SUBLIMAZE) injection 50 mcg (50 mcg Intravenous Given 02/10/12 0433)     Review of her prior admission from May 2013 shows that they did not feel she had pancreatitis, her lipase was normal and her CT scan did not show acute inflammation of her pancreas. She did have a delayed emptying of her  stomach, in the face of opioids and she was discharged on Reglan before meals. She had endoscopy done which showed mild distal esophageal erosions and mild reflux.  Recheck patient feeling better, have discussed her last admission and that she is not having pancreatitis.   Results for orders placed during the hospital encounter of 02/10/12  CBC WITH DIFFERENTIAL      Component Value Range   WBC 4.8  4.0 - 10.5 K/uL   RBC 4.40  3.87 - 5.11 MIL/uL   Hemoglobin 13.9  12.0 - 15.0 g/dL   HCT 96.2  95.2 - 84.1 %   MCV 93.6  78.0 - 100.0 fL   MCH 31.6  26.0 - 34.0 pg   MCHC 33.7  30.0 - 36.0 g/dL   RDW 45.4  09.8 - 11.9 %   Platelets 245  150 - 400 K/uL   Neutrophils Relative 41 (*) 43 - 77 %   Neutro Abs 2.0  1.7 - 7.7 K/uL   Lymphocytes Relative 44  12 - 46 %   Lymphs Abs 2.1  0.7 - 4.0 K/uL   Monocytes Relative 7  3 - 12 %   Monocytes Absolute 0.4  0.1 - 1.0 K/uL   Eosinophils Relative 7 (*) 0 - 5 %   Eosinophils Absolute 0.3  0.0 - 0.7 K/uL   Basophils Relative 1  0 - 1 %   Basophils Absolute 0.1  0.0 - 0.1 K/uL  COMPREHENSIVE METABOLIC PANEL      Component Value Range   Sodium 143  135 - 145 mEq/L   Potassium 3.2 (*) 3.5 - 5.1 mEq/L   Chloride 108  96 - 112 mEq/L   CO2 26  19 - 32 mEq/L   Glucose, Bld 97  70 - 99 mg/dL   BUN 10  6 - 23 mg/dL   Creatinine, Ser 1.47  0.50 - 1.10 mg/dL   Calcium 9.0  8.4 - 82.9 mg/dL   Total Protein 6.9  6.0 - 8.3 g/dL   Albumin 3.2 (*) 3.5 - 5.2 g/dL   AST 64 (*) 0 - 37 U/L   ALT 99 (*) 0 - 35 U/L   Alkaline Phosphatase 86  39 - 117 U/L   Total Bilirubin 0.3  0.3 - 1.2 mg/dL   GFR calc non Af Amer >90  >90 mL/min   GFR calc Af Amer >90  >90 mL/min  LIPASE, BLOOD      Component Value Range   Lipase 21  11 - 59 U/L   Laboratory interpretation all normal except minor elevation of LFTs with normal lipase and total bilirubin    No results found.   Date: 02/10/2012  Rate: 107  Rhythm: sinus tachycardia  QRS Axis: right  Intervals:  normal  ST/T Wave abnormalities: normal  Conduction Disutrbances:none  Narrative Interpretation:   Old EKG Reviewed: none available    1. Epigastric abdominal pain     New Prescriptions   PROMETHAZINE (PHENERGAN) 25 MG SUPPOSITORY    Unwrap and insert 1 PR PRN nausea, vomiting or headache   Plan discharge   Devoria Albe, MD, FACEP   MDM          Ward Givens, MD 02/10/12 [phone]

## 2012-02-10 NOTE — ED Notes (Signed)
Patient complaining of left upper quadrant abdominal pain radiating into chest and back x 2 days. Also complaining of vomiting.

## 2012-02-23 ENCOUNTER — Encounter (HOSPITAL_COMMUNITY): Payer: Self-pay | Admitting: Pharmacy Technician

## 2012-02-29 ENCOUNTER — Encounter (HOSPITAL_COMMUNITY): Payer: Self-pay

## 2012-02-29 ENCOUNTER — Encounter (HOSPITAL_COMMUNITY)
Admission: RE | Admit: 2012-02-29 | Discharge: 2012-02-29 | Disposition: A | Discharge status: Home / Self Care | Payer: Medicaid Other | Source: Ambulatory Visit | Attending: Oral Surgery | Admitting: Oral Surgery

## 2012-02-29 HISTORY — DX: Chronic obstructive pulmonary disease, unspecified: J44.9

## 2012-02-29 LAB — CBC
MCH: 31.7 pg (ref 26.0–34.0)
Platelets: 242 10*3/uL (ref 150–400)
RBC: 4.51 MIL/uL (ref 3.87–5.11)
WBC: 5.9 10*3/uL (ref 4.0–10.5)

## 2012-02-29 LAB — BASIC METABOLIC PANEL
Calcium: 9.4 mg/dL (ref 8.4–10.5)
GFR calc Af Amer: >90 mL/min (ref >90)
GFR calc non Af Amer: >90 mL/min (ref >90)
Potassium: 4.7 mEq/L (ref 3.5–5.1)
Sodium: 137 mEq/L (ref 135–145)

## 2012-02-29 NOTE — Pre-Procedure Instructions (Signed)
20 Marjie Marchioni [place]  02/29/2012   Your procedure is scheduled on:  Monday March 06, 2012 at  1015 AM  Report to [facility] at 0715 AM.  Call this number if you have problems the morning of surgery: [phone]   Remember:   Do not eat food or drink:After Midnight.Sunday      Take these medicines the morning of surgery with A SIP OF WATER: Reglan and Oxycodone if needed   Do not wear jewelry, make-up or nail polish.  Do not wear lotions, powders, or perfumes. You may wear deodorant.  Do not shave 48 hours prior to surgery.   Do not bring valuables to the hospital.  Contacts, dentures or bridgework may not be worn into surgery.  Leave suitcase in the car. After surgery it may be brought to your room.  For patients admitted to the hospital, checkout time is 11:00 AM the day of discharge.   Patients discharged the day of surgery will not be allowed to drive home.  Name and phone number of your driver:   Special Instructions: Shower using CHG 2 nights before surgery and the night before surgery.  If you shower the day of surgery use CHG.  Use special wash - you have one bottle of CHG for all showers.  You should use approximately 1/3 of the bottle for each shower.   Please read over the following fact sheets that you were given: Pain Booklet, Coughing and Deep Breathing and Surgical Site Infection Prevention

## 2012-03-02 NOTE — H&P (Signed)
HISTORY AND PHYSICAL  Andrea Nelson is a 46 y.o. female patient with CC: Dental pain  No diagnosis found.  Past Medical History  Diagnosis Date  . Colitis 11/2010    ?  [name] Lupus   . DDD (degenerative disc disease)   . Pancreatitis 2012    [name]  . Uterine cancer 1996  . Adenomatous polyp of colon 02/2010  . PUD (peptic ulcer disease)   . COPD (chronic obstructive pulmonary disease)     No current facility-administered medications for this encounter.   Current Outpatient Prescriptions  Medication Sig Dispense Refill  . metoCLOPramide (REGLAN) 10 MG tablet Take 10 mg by mouth 4 (four) times daily -  with meals and at bedtime.       . Oxycodone HCl 10 MG TABS Take 10 mg by mouth every 6 (six) hours as needed. For pain       Allergies  Allergen Reactions  . Honey Bee Venom Anaphylaxis  . Ibuprofen Anaphylaxis    Mouth swelling and itching  . Ketorolac Tromethamine Hives    Blisters near injection site  . Penicillins Hives and Swelling   Active Problems:  * No active hospital problems. *   Vitals: There were no vitals taken for this visit. Lab results:No results found for this or any previous visit (from the past 24 hour(s)). Radiology Results: Dg Chest 2 View  02/29/2012  *RADIOLOGY REPORT*  Clinical Data: COPD.  Hypertension.  CHEST - 2 VIEW  Comparison: Plain films of the chest 09/07/2011.  Findings: The chest is hyperexpanded but the lungs are clear. Heart size is normal.  No pneumothorax or pleural fluid.  IMPRESSION: Pulmonary hyperexpansion compatible with emphysema.  No acute disease.   Original Report Authenticated By: Bernadene Bell. Maricela Curet, M.D.    General appearance: alert, cooperative and no distress Head: Normocephalic, without obvious abnormality, atraumatic Eyes: negative Ears: normal TM's and external ear canals both ears Nose: Nares normal. Septum midline. Mucosa normal. No drainage or sinus tenderness. Throat: Severe dental caries teeth #'s 6, 7, 8, 9, 10,  11, 12, 13, 22, 23, 24, 25, 26, 27, soft tissue lesions palate. Neck: no adenopathy, supple, symmetrical, trachea midline and thyroid not enlarged, symmetric, no tenderness/mass/nodules Resp: clear to auscultation bilaterally Cardio: regular rate and rhythm, S1, S2 normal, no murmur, click, rub or gallop  Assessment: 46 YO WF COPD, Peptic Ulcer Disease, Pancreatitis Colitis, lupus, DDD, with severe dental caries teeth #'s 6, 7, 8, 9, 10, 11, 12, 13, 22, 23, 24, 25, 26, 27, soft tissue lesions palate.  Plan: Extract teeth #'s 6, 7, 8, 9, 10, 11, 12, 13, 22, 23, 24, 25, 26, 27, remove soft tissue lesions palate. General anesthesia. Day surgery.    Andrea Nelson 03/02/2012

## 2012-03-06 ENCOUNTER — Encounter (HOSPITAL_COMMUNITY)
Admission: RE | Disposition: A | Discharge status: Home / Self Care | Payer: Self-pay | Source: Ambulatory Visit | Attending: Oral Surgery

## 2012-03-06 ENCOUNTER — Ambulatory Visit (HOSPITAL_COMMUNITY)
Admission: RE | Admit: 2012-03-06 | Discharge: 2012-03-06 | Disposition: A | Discharge status: Home / Self Care | Payer: Medicaid Other | Source: Ambulatory Visit | Attending: Oral Surgery | Admitting: Oral Surgery

## 2012-03-06 ENCOUNTER — Encounter (HOSPITAL_COMMUNITY): Payer: Self-pay | Admitting: *Deleted

## 2012-03-06 ENCOUNTER — Ambulatory Visit (HOSPITAL_COMMUNITY): Payer: Medicaid Other | Admitting: Anesthesiology

## 2012-03-06 ENCOUNTER — Encounter (HOSPITAL_COMMUNITY): Payer: Self-pay | Admitting: Anesthesiology

## 2012-03-06 DIAGNOSIS — J4489 Other specified chronic obstructive pulmonary disease: Secondary | ICD-10-CM | POA: Insufficient documentation

## 2012-03-06 DIAGNOSIS — K1370 Unspecified lesions of oral mucosa: Secondary | ICD-10-CM | POA: Insufficient documentation

## 2012-03-06 DIAGNOSIS — K061 Gingival enlargement: Secondary | ICD-10-CM

## 2012-03-06 DIAGNOSIS — Z01810 Encounter for preprocedural cardiovascular examination: Secondary | ICD-10-CM | POA: Insufficient documentation

## 2012-03-06 DIAGNOSIS — K05329 Chronic periodontitis, generalized, unspecified severity: Secondary | ICD-10-CM

## 2012-03-06 DIAGNOSIS — Z01818 Encounter for other preprocedural examination: Secondary | ICD-10-CM | POA: Insufficient documentation

## 2012-03-06 DIAGNOSIS — K029 Dental caries, unspecified: Secondary | ICD-10-CM | POA: Insufficient documentation

## 2012-03-06 DIAGNOSIS — Z01812 Encounter for preprocedural laboratory examination: Secondary | ICD-10-CM | POA: Insufficient documentation

## 2012-03-06 DIAGNOSIS — J449 Chronic obstructive pulmonary disease, unspecified: Secondary | ICD-10-CM | POA: Insufficient documentation

## 2012-03-06 DIAGNOSIS — R1115 Cyclical vomiting syndrome unrelated to migraine: Secondary | ICD-10-CM

## 2012-03-06 DIAGNOSIS — E860 Dehydration: Secondary | ICD-10-CM

## 2012-03-06 DIAGNOSIS — M329 Systemic lupus erythematosus, unspecified: Secondary | ICD-10-CM

## 2012-03-06 DIAGNOSIS — R1013 Epigastric pain: Secondary | ICD-10-CM

## 2012-03-06 HISTORY — PX: MULTIPLE EXTRACTIONS WITH ALVEOLOPLASTY: SHX5342

## 2012-03-06 SURGERY — MULTIPLE EXTRACTION WITH ALVEOLOPLASTY
Anesthesia: General | Laterality: Bilateral | Wound class: Clean Contaminated

## 2012-03-06 MED ORDER — 0.9 % SODIUM CHLORIDE (POUR BTL) OPTIME
TOPICAL | Status: DC | PRN
  Administered 2012-03-06: 1000 mL

## 2012-03-06 MED ORDER — LIDOCAINE-EPINEPHRINE 2 %-1:100000 IJ SOLN
INTRAMUSCULAR | Status: AC
  Filled 2012-03-06: qty 1

## 2012-03-06 MED ORDER — MIDAZOLAM HCL 5 MG/5ML IJ SOLN
INTRAMUSCULAR | Status: DC | PRN
  Administered 2012-03-06: 2 mg via INTRAVENOUS

## 2012-03-06 MED ORDER — PROPOFOL 10 MG/ML IV BOLUS
INTRAVENOUS | Status: DC | PRN
  Administered 2012-03-06: 200 mg via INTRAVENOUS

## 2012-03-06 MED ORDER — SODIUM CHLORIDE 0.9 % IR SOLN
Status: DC | PRN
  Administered 2012-03-06: 1000 mL

## 2012-03-06 MED ORDER — FENTANYL CITRATE 0.05 MG/ML IJ SOLN
INTRAMUSCULAR | Status: DC | PRN
  Administered 2012-03-06: 50 ug via INTRAVENOUS
  Administered 2012-03-06 (×2): 100 ug via INTRAVENOUS

## 2012-03-06 MED ORDER — LACTATED RINGERS IV SOLN
INTRAVENOUS | Status: DC | PRN
  Administered 2012-03-06: 09:00:00 via INTRAVENOUS

## 2012-03-06 MED ORDER — ALBUTEROL SULFATE HFA 108 (90 BASE) MCG/ACT IN AERS
INHALATION_SPRAY | RESPIRATORY_TRACT | Status: DC | PRN
  Administered 2012-03-06: 4 via RESPIRATORY_TRACT

## 2012-03-06 MED ORDER — OXYCODONE HCL 5 MG PO TABS: 5.0000 mg | ORAL_TABLET | Freq: Once | ORAL | Status: DC | PRN

## 2012-03-06 MED ORDER — HYDROMORPHONE HCL 2 MG PO TABS: 2.0000 mg | ORAL_TABLET | ORAL | Status: DC | PRN

## 2012-03-06 MED ORDER — HYDROMORPHONE HCL PF 1 MG/ML IJ SOLN
INTRAMUSCULAR | Status: AC
  Filled 2012-03-06: qty 1

## 2012-03-06 MED ORDER — LIDOCAINE-EPINEPHRINE 2 %-1:100000 IJ SOLN
INTRAMUSCULAR | Status: DC | PRN
  Administered 2012-03-06: 14 mL

## 2012-03-06 MED ORDER — HYDROMORPHONE HCL PF 1 MG/ML IJ SOLN
0.2500 mg | INTRAMUSCULAR | Status: DC | PRN
  Administered 2012-03-06 (×4): 0.5 mg via INTRAVENOUS

## 2012-03-06 MED ORDER — PROMETHAZINE HCL 25 MG/ML IJ SOLN: 6.2500 mg | INTRAMUSCULAR | Status: DC | PRN

## 2012-03-06 MED ORDER — OXYCODONE HCL 5 MG/5ML PO SOLN: 5.0000 mg | Freq: Once | ORAL | Status: DC | PRN

## 2012-03-06 MED ORDER — ARTIFICIAL TEARS OP OINT
TOPICAL_OINTMENT | OPHTHALMIC | Status: DC | PRN
  Administered 2012-03-06: 1 via OPHTHALMIC

## 2012-03-06 MED ORDER — LIDOCAINE HCL (CARDIAC) 20 MG/ML IV SOLN
INTRAVENOUS | Status: DC | PRN
  Administered 2012-03-06: 100 mg via INTRAVENOUS

## 2012-03-06 MED ORDER — SUCCINYLCHOLINE CHLORIDE 20 MG/ML IJ SOLN
INTRAMUSCULAR | Status: DC | PRN
  Administered 2012-03-06: 100 mg via INTRAVENOUS

## 2012-03-06 SURGICAL SUPPLY — 30 items
BUR CROSS CUT FISSURE 1.6 (BURR) ×2 IMPLANT
BUR EGG ELITE 4.0 (BURR) ×1 IMPLANT
CANISTER SUCTION 2500CC (MISCELLANEOUS) ×2 IMPLANT
CLOTH BEACON ORANGE TIMEOUT ST (SAFETY) ×2 IMPLANT
COVER SURGICAL LIGHT HANDLE (MISCELLANEOUS) ×2 IMPLANT
CRADLE DONUT ADULT HEAD (MISCELLANEOUS) ×2 IMPLANT
DECANTER SPIKE VIAL GLASS SM (MISCELLANEOUS) ×2 IMPLANT
GAUZE PACKING FOLDED 2  STR (GAUZE/BANDAGES/DRESSINGS) ×1
GAUZE PACKING FOLDED 2 STR (GAUZE/BANDAGES/DRESSINGS) ×1 IMPLANT
GLOVE BIO SURGEON STRL SZ 6.5 (GLOVE) ×2 IMPLANT
GLOVE BIO SURGEON STRL SZ7.5 (GLOVE) ×2 IMPLANT
GLOVE BIOGEL PI IND STRL 6.5 (GLOVE) IMPLANT
GLOVE BIOGEL PI IND STRL 7.0 (GLOVE) ×1 IMPLANT
GLOVE BIOGEL PI INDICATOR 6.5 (GLOVE) ×1
GLOVE BIOGEL PI INDICATOR 7.0 (GLOVE) ×1
GOWN STRL NON-REIN LRG LVL3 (GOWN DISPOSABLE) ×2 IMPLANT
GOWN STRL REIN XL XLG (GOWN DISPOSABLE) ×2 IMPLANT
KIT BASIN OR (CUSTOM PROCEDURE TRAY) ×2 IMPLANT
KIT ROOM TURNOVER OR (KITS) ×2 IMPLANT
NEEDLE 22X1 1/2 (OR ONLY) (NEEDLE) ×2 IMPLANT
NS IRRIG 1000ML POUR BTL (IV SOLUTION) ×2 IMPLANT
PAD ARMBOARD 7.5X6 YLW CONV (MISCELLANEOUS) ×4 IMPLANT
SUT CHROMIC 3 0 PS 2 (SUTURE) ×2 IMPLANT
SUT CHROMIC 3 0 SH 27 (SUTURE) ×2 IMPLANT
SYR CONTROL 10ML LL (SYRINGE) ×2 IMPLANT
TOWEL OR 17X26 10 PK STRL BLUE (TOWEL DISPOSABLE) ×2 IMPLANT
TRAY ENT MC OR (CUSTOM PROCEDURE TRAY) ×2 IMPLANT
TUBING IRRIGATION (MISCELLANEOUS) ×1 IMPLANT
WATER STERILE IRR 1000ML POUR (IV SOLUTION) IMPLANT
YANKAUER SUCT BULB TIP NO VENT (SUCTIONS) ×2 IMPLANT

## 2012-03-06 NOTE — Progress Notes (Signed)
Pt reports 10/10 pain- neck and mouth pain. Given Dilaudid IVx4 in PACU.  Pt wishes to take oral med and proceed home to take oral meds. Pt & mother given discharge instructions, & discharged to home.

## 2012-03-06 NOTE — Anesthesia Preprocedure Evaluation (Addendum)
Anesthesia Evaluation  Patient identified by MRN, date of birth, ID band Patient awake    Reviewed: Allergy & Precautions, H&P , NPO status , Patient's Chart, lab work & pertinent test results  History of Anesthesia Complications Negative for: history of anesthetic complications  Airway Mallampati: II TM Distance: >3 FB Neck ROM: Full    Dental  (+) Poor Dentition and Dental Advisory Given   Pulmonary COPD COPD inhaler,    Pulmonary exam normal       Cardiovascular negative cardio ROS  Rhythm:Regular Rate:Normal     Neuro/Psych negative neurological ROS  negative psych ROS   GI/Hepatic Neg liver ROS, PUD,   Endo/Other  negative endocrine ROS  Renal/GU negative Renal ROS     Musculoskeletal   Abdominal   Peds  Hematology negative hematology ROS (+)   Anesthesia Other Findings   Reproductive/Obstetrics                          Anesthesia Physical Anesthesia Plan  ASA: II  Anesthesia Plan: General   Post-op Pain Management:    Induction: Intravenous  Airway Management Planned: Oral ETT  Additional Equipment:   Intra-op Plan:   Post-operative Plan: Extubation in OR  Informed Consent: I have reviewed the patients History and Physical, chart, labs and discussed the procedure including the risks, benefits and alternatives for the proposed anesthesia with the patient or authorized representative who has indicated his/her understanding and acceptance.   Dental advisory given  Plan Discussed with: CRNA, Anesthesiologist and Surgeon  Anesthesia Plan Comments:         Anesthesia Quick Evaluation

## 2012-03-06 NOTE — Op Note (Signed)
03/06/2012  9:44 AM  PATIENT:  Andrea Nelson  46 y.o. female  PRE-OPERATIVE DIAGNOSIS:  NONRESTORABLE TEETH #'s 6, 7, 8, 9, 10, 11, 12, 13, 22, 23, 24, 25, 26, 27,  PALATAL LESIONS  POST-OPERATIVE DIAGNOSIS:  SAME  PROCEDURE:  Procedure(s): MULTIPLE EXTRACION TEETH #'s 6, 7, 8, 9, 10, 11, 12, 13, 22, 23, 24, 25, 26, 27, WITH ALVEOLOPLASTY, removal  PALATAL LESIONS  SURGEON:  Surgeon(s): [name], DDS  ANESTHESIA:   local and general  EBL:  minimal  DRAINS: none   SPECIMEN:  Right palatal hyperplastic tissue, left palatal hyperplastic tissue  COUNTS:  YES  PLAN OF CARE: Discharge to home after PACU  PATIENT DISPOSITION:  PACU - hemodynamically stable.   PROCEDURE DETAILS: Dictation #[id number]  [name], DMD 03/06/2012 9:44 AM

## 2012-03-06 NOTE — H&P (Signed)
H&P documentation  -History and Physical Reviewed  -Patient has been re-examined  -No change in the plan of care  Rexine Gowens M  

## 2012-03-06 NOTE — Transfer of Care (Signed)
Immediate Anesthesia Transfer of Care Note  Patient: Andrea Nelson  Procedure(s) Performed: Procedure(s) (LRB) with comments: MULTIPLE EXTRACION WITH ALVEOLOPLASTY (Bilateral) - PALATAL EXCISE, PALATAL LESIONS WITH BIOPSY  Patient Location: PACU  Anesthesia Type:No value filed.  Level of Consciousness: awake and confused  Airway & Oxygen Therapy: Patient Spontanous Breathing, Patient connected to nasal cannula oxygen and Patient connected to face mask oxygen  Post-op Assessment: Report given to PACU RN, Post -op Vital signs reviewed and stable and Patient moving all extremities X 4  Post vital signs: Reviewed and stable  Complications: No apparent anesthesia complications

## 2012-03-06 NOTE — Anesthesia Postprocedure Evaluation (Signed)
Anesthesia Post Note  Patient: Andrea Nelson  Procedure(s) Performed: Procedure(s) (LRB): MULTIPLE EXTRACION WITH ALVEOLOPLASTY (Bilateral)  Anesthesia type: general  Patient location: PACU  Post pain: Pain level controlled  Post assessment: Patient's Cardiovascular Status Stable  Last Vitals:  Filed Vitals:   03/06/12 1100  BP: 108/67  Pulse:   Temp:   Resp:     Post vital signs: Reviewed and stable  Level of consciousness: sedated  Complications: No apparent anesthesia complications

## 2012-03-07 NOTE — Op Note (Signed)
NAMEALAYSA, DIGILIO               ACCOUNT NO.:  [id number]  MEDICAL RECORD NO.:  [id number]  LOCATION:  [facility]                         FACILITY:  [facility]  PHYSICIAN:  [name], M.D.  DATE OF BIRTH:  [birth year]  DATE OF PROCEDURE:  03/06/2012 DATE OF DISCHARGE:  03/06/2012                              OPERATIVE REPORT   PREOPERATIVE DIAGNOSIS:  Nonrestorable teeth #6, #7, #8, #9, #10, #11, #12, #13, #22, #23, #24, #25, #26, #27, lesions right and left anterior palate.  POSTOPERATIVE DIAGNOSIS:  Nonrestorable teeth #6, #7, #8, #9, #10, #11, #12, #13, #22, #23, #24, #25, #26, #27, lesions right and left anterior palate.  PROCEDURE:  Extraction teeth #6, #7, #8, #9, #10, #11, #12, #13, #22, #23, #24, #25, #26, #27, alveoplasty right and left maxilla and mandible, removal of right and left palatal lesions.  SURGEON:  [name], M.D.  ANESTHESIA:  General, oral intubation.  INDICATION FOR PROCEDURE:  Andrea Nelson is a 46 year old female who is referred to my office by her general dentist for removal of all remaining teeth secondary to dental caries.  Because of the number of teeth and the patient's medical history, it was recommended that surgery be performed with general anesthesia with airway protection with intubation.  PROCEDURE:  The patient was taken to the operating room, placed on the table in supine position.  General anesthesia was administered intravenously and an oral endotracheal tube was placed and marked.  The eyes were protected.  The patient was draped for the procedure.  Time- out was performed.  2% lidocaine with 1:100,000 epinephrine was infiltrated in an inferior alveolar block on the right and left sides. The anesthesia was then administered in buccal and palatal infiltration in the maxilla.  Total of 14 mL was utilized.  Sweetheart retractor and bite block were placed in the mouth on the right side and the left side was operated first.  A 15-blade was  used to make a full-thickness incision around teeth #22, #23, #24, #25, #25, #26 on the buccal and lingual aspects in the mandible.  A 15-blade was then also used to make an incision on the buccal and palatal aspects of teeth #13, #12, #11, #10, #9, #8, #7.  The periosteum was reflected with a periosteal elevator.  The teeth were removed with the rongeurs and universal forceps in the maxilla and mandible.  Sockets were curetted.  The periosteum was further reflected in the maxilla mandible with a periosteal elevator and then using an egg-shaped bur and bone file alveoplasty was performed in the left maxilla and mandible.  The areas were then sutured with 3-0 chromic.  Then, a 15-blade was used to take the hyperplastic tissue in the anterior maxilla on the right and left sides.  This tissue was submitted for pathological examination.  The bite block and Sweetheart retractor were then replaced to seal the other side of the mouth.  A 15-blade was used to make a full-thickness incision around tooth #6 and distal to it in a wedge-shaped incision in the mandible.  Excision was created around teeth #27 and #28 with the distal wedge as well.  Periosteum was reflected with a periosteal elevator.  The teeth were removed with the forceps.  Tooth #27 required bone removal with a fissure bur, and handpiece under irrigation.  The sockets were then curetted after removing the teeth and then the periosteum was further reflected to expose the alveolar crest in the maxilla and mandible.  Then, the egg-shaped bur and bone file were used to perform the alveoplasty.  Then, the areas were sutured with 3-0 chromic.  The oral cavity was inspected, found to have good contour, hemostasis and closure.  The oral cavity was irrigated and suctioned, and a throat pack was removed.  The patient was awakened, taken to the recovery room, breathing spontaneously in good condition.  ESTIMATED BLOOD LOSS:   Minimum.  COMPLICATIONS:  None.  SPECIMEN:  Hyperplastic tissue, right and left maxilla.     [name], M.D.     SMJ/MEDQ  D:  03/06/2012  T:  03/07/2012  Job:  [id number]

## 2012-03-09 ENCOUNTER — Encounter (HOSPITAL_COMMUNITY): Payer: Self-pay | Admitting: Oral Surgery

## 2012-03-17 ENCOUNTER — Emergency Department (HOSPITAL_COMMUNITY)
Admission: EM | Admit: 2012-03-17 | Discharge: 2012-03-17 | Disposition: A | Discharge status: Home / Self Care | Payer: Medicaid Other | Attending: Emergency Medicine | Admitting: Emergency Medicine

## 2012-03-17 ENCOUNTER — Encounter (HOSPITAL_COMMUNITY): Payer: Self-pay

## 2012-03-17 ENCOUNTER — Emergency Department (HOSPITAL_COMMUNITY): Payer: Medicaid Other

## 2012-03-17 DIAGNOSIS — R7401 Elevation of levels of liver transaminase levels: Secondary | ICD-10-CM | POA: Insufficient documentation

## 2012-03-17 DIAGNOSIS — Z87891 Personal history of nicotine dependence: Secondary | ICD-10-CM | POA: Insufficient documentation

## 2012-03-17 DIAGNOSIS — R109 Unspecified abdominal pain: Secondary | ICD-10-CM | POA: Insufficient documentation

## 2012-03-17 DIAGNOSIS — Z79899 Other long term (current) drug therapy: Secondary | ICD-10-CM | POA: Insufficient documentation

## 2012-03-17 DIAGNOSIS — Z862 Personal history of diseases of the blood and blood-forming organs and certain disorders involving the immune mechanism: Secondary | ICD-10-CM | POA: Insufficient documentation

## 2012-03-17 DIAGNOSIS — R7402 Elevation of levels of lactic acid dehydrogenase (LDH): Secondary | ICD-10-CM | POA: Insufficient documentation

## 2012-03-17 DIAGNOSIS — J449 Chronic obstructive pulmonary disease, unspecified: Secondary | ICD-10-CM | POA: Insufficient documentation

## 2012-03-17 DIAGNOSIS — J4489 Other specified chronic obstructive pulmonary disease: Secondary | ICD-10-CM | POA: Insufficient documentation

## 2012-03-17 DIAGNOSIS — L930 Discoid lupus erythematosus: Secondary | ICD-10-CM | POA: Insufficient documentation

## 2012-03-17 DIAGNOSIS — Z8711 Personal history of peptic ulcer disease: Secondary | ICD-10-CM | POA: Insufficient documentation

## 2012-03-17 DIAGNOSIS — Z8639 Personal history of other endocrine, nutritional and metabolic disease: Secondary | ICD-10-CM | POA: Insufficient documentation

## 2012-03-17 DIAGNOSIS — Z8542 Personal history of malignant neoplasm of other parts of uterus: Secondary | ICD-10-CM | POA: Insufficient documentation

## 2012-03-17 LAB — COMPREHENSIVE METABOLIC PANEL
ALT: 138 U/L — ABNORMAL HIGH (ref 0–35)
AST: 77 U/L — ABNORMAL HIGH (ref 0–37)
Alkaline Phosphatase: 90 U/L (ref 39–117)
CO2: 26 mEq/L (ref 19–32)
Calcium: 10.6 mg/dL — ABNORMAL HIGH (ref 8.4–10.5)
Chloride: 102 mEq/L (ref 96–112)
GFR calc Af Amer: >90 mL/min (ref >90)
GFR calc non Af Amer: >90 mL/min (ref >90)
Glucose, Bld: 97 mg/dL (ref 70–99)
Potassium: 4.2 mEq/L (ref 3.5–5.1)
Sodium: 139 mEq/L (ref 135–145)
Total Bilirubin: 0.5 mg/dL (ref 0.3–1.2)

## 2012-03-17 LAB — TROPONIN I: Troponin I: <0.3 ng/mL (ref <0.30)

## 2012-03-17 LAB — URINALYSIS, ROUTINE W REFLEX MICROSCOPIC
Bilirubin Urine: NEGATIVE
Glucose, UA: NEGATIVE mg/dL
Hgb urine dipstick: NEGATIVE
Protein, ur: NEGATIVE mg/dL

## 2012-03-17 LAB — CBC WITH DIFFERENTIAL/PLATELET
Basophils Absolute: 0.1 10*3/uL (ref 0.0–0.1)
Lymphocytes Relative: 26 % (ref 12–46)
Lymphs Abs: 2.4 10*3/uL (ref 0.7–4.0)
MCV: 91.7 fL (ref 78.0–100.0)
Neutro Abs: 6.1 10*3/uL (ref 1.7–7.7)
Platelets: 302 10*3/uL (ref 150–400)
RBC: 5.18 MIL/uL — ABNORMAL HIGH (ref 3.87–5.11)
RDW: 12.3 % (ref 11.5–15.5)
WBC: 9 10*3/uL (ref 4.0–10.5)

## 2012-03-17 MED ORDER — SODIUM CHLORIDE 0.9 % IV BOLUS (SEPSIS)
1000.0000 mL | Freq: Once | INTRAVENOUS | Status: AC
  Administered 2012-03-17: 1000 mL via INTRAVENOUS

## 2012-03-17 MED ORDER — PANTOPRAZOLE SODIUM 40 MG IV SOLR
40.0000 mg | Freq: Once | INTRAVENOUS | Status: AC
  Administered 2012-03-17: 40 mg via INTRAVENOUS
  Filled 2012-03-17: qty 40

## 2012-03-17 MED ORDER — ONDANSETRON HCL 4 MG/2ML IJ SOLN
4.0000 mg | Freq: Once | INTRAMUSCULAR | Status: AC
  Administered 2012-03-17: 4 mg via INTRAVENOUS
  Filled 2012-03-17: qty 2

## 2012-03-17 MED ORDER — HYDROMORPHONE HCL PF 1 MG/ML IJ SOLN
1.0000 mg | Freq: Once | INTRAMUSCULAR | Status: AC
  Administered 2012-03-17: 1 mg via INTRAVENOUS
  Filled 2012-03-17: qty 1

## 2012-03-17 MED ORDER — SODIUM CHLORIDE 0.9 % IV SOLN: Freq: Once | INTRAVENOUS | Status: DC

## 2012-03-17 MED ORDER — PANTOPRAZOLE SODIUM 40 MG PO TBEC: 40.0000 mg | DELAYED_RELEASE_TABLET | Freq: Every day | ORAL | Status: DC

## 2012-03-17 MED ORDER — OXYCODONE-ACETAMINOPHEN 5-325 MG PO TABS: 1.0000 | ORAL_TABLET | ORAL | Status: DC | PRN

## 2012-03-17 MED ORDER — METOCLOPRAMIDE HCL 5 MG/ML IJ SOLN
10.0000 mg | Freq: Once | INTRAMUSCULAR | Status: AC
  Administered 2012-03-17: 10 mg via INTRAVENOUS
  Filled 2012-03-17: qty 2

## 2012-03-17 MED ORDER — METOCLOPRAMIDE HCL 10 MG PO TABS: 10.0000 mg | ORAL_TABLET | Freq: Four times a day (QID) | ORAL | Status: DC | PRN

## 2012-03-17 NOTE — ED Notes (Signed)
Pt called call light and stated that she ws in a lot of pain. Staff entered room to check on pt. Pt was tearful and states that her pain is still a 7/10. NAD noted at this time.

## 2012-03-17 NOTE — ED Notes (Signed)
Pt reports having mid sternal cp that she first noted yesterday, during the night she started having nausea and vomiting.   Just had oral surgery on oct 29, she is afraid of damaging the sutures.   Denies any fevfer or cough, no sob.

## 2012-03-17 NOTE — ED Provider Notes (Signed)
History    This chart was scribed for Dione Booze, MD, MD by Smitty Pluck, ED Scribe. The patient was seen in room APA07 and the patient's care was started at 9:28AM.   CSN: [id number]  Arrival date & time 03/17/12  0910      No chief complaint on file.   (Consider location/radiation/quality/duration/timing/severity/associated sxs/prior treatment) The history is provided by the patient. No language interpreter was used.   Andrea Nelson is a 46 y.o. female with hx of colitis, Lupus, pancreatitis, uterine cancer, PUD and COPD who presents to the Emergency Department complaining of constant, moderate lower chest and upper abdominal pain onset 1 day ago. She feels the pain is "shooting" and radiating to her back She states the pain has worsened. Pain is rated at 8/10. Pt reports that she vomited after onset of pain. Pt denies SOB, diaphoresis, fever, cough, constipation and diarrhea. She stopped smoking cigarettes 4 weeks ago and she denies drinking alcohol. She reports that she has just had oral surgery on 03-07-12.   Past Medical History  Diagnosis Date  . Colitis 11/2010    ?  [name] Lupus   . DDD (degenerative disc disease)   . Pancreatitis 2012    [name]  . Uterine cancer 1996  . Adenomatous polyp of colon 02/2010  . PUD (peptic ulcer disease)   . COPD (chronic obstructive pulmonary disease)     Past Surgical History  Procedure Date  . Abdominal hysterectomy 1996    w/ right salpingoopherectomy  . Appendectomy 1977  . Cholecystectomy 1987  . Cesarean section 1987  . Abdominal exploration surgery 1996    fibroid  . Colonoscopy w/ biopsies 02/2010    Dr Opal Sidles, [facility] [facility]  . Esophagogastroduodenoscopy 02/2010    Dr Lamar Laundry  . Shoulder surgery   . Esophagogastroduodenoscopy 09/21/11    Rourk-mild erosive esophagitis, small hiatal hernia, esophageal nodules  . Esophagogastroduodenoscopy 09/20/2011    Procedure: ESOPHAGOGASTRODUODENOSCOPY (EGD);  Surgeon: Corbin Ade, MD;   Location: [facility];  Service: Endoscopy;  Laterality: N/A;  . Multiple extractions with alveoloplasty 03/06/2012    Procedure: MULTIPLE EXTRACION WITH ALVEOLOPLASTY;  Surgeon: [name], DDS;  Location: [facility];  Service: Oral Surgery;  Laterality: Bilateral;  PALATAL EXCISE, PALATAL LESIONS WITH BIOPSY    Family History  Problem Relation Age of Onset  . Ulcers Mother   . Colon cancer Maternal Grandmother     History  Substance Use Topics  . Smoking status: Current Every Day Smoker -- 0.5 packs/day for 25 years    Types: Cigarettes  . Smokeless tobacco: Not on file  . Alcohol Use: No    OB History    Grav Para Term Preterm Abortions TAB SAB Ect Mult Living                  Review of Systems  All other systems reviewed and are negative.  10 Systems reviewed and all are negative for acute change except as noted in the HPI.    Allergies  Honey bee venom; Ibuprofen; Ketorolac tromethamine; and Penicillins  Home Medications   Current Outpatient Rx  Name  Route  Sig  Dispense  Refill  . HYDROMORPHONE HCL 2 MG PO TABS   Oral   Take 1 tablet (2 mg total) by mouth every 4 (four) hours as needed for pain.   30 tablet   0   . METOCLOPRAMIDE HCL 10 MG PO TABS   Oral   Take 10 mg by  mouth 4 (four) times daily -  with meals and at bedtime.          . OXYCODONE HCL 10 MG PO TABS   Oral   Take 10 mg by mouth every 6 (six) hours as needed. For pain           BP 122/93  Pulse 120  Temp 98.2 F (36.8 C)  Resp 22  SpO2 96%  Physical Exam  Nursing note and vitals reviewed. Constitutional: She is oriented to person, place, and time. She appears well-developed and well-nourished. No distress.  HENT:  Head: Normocephalic and atraumatic.       Sutures present in gingiva and incision appears well healed   Neck: Normal range of motion. Neck supple.  Cardiovascular: Normal rate, regular rhythm and normal heart sounds.   Pulmonary/Chest: Effort normal and breath  sounds normal. No respiratory distress.  Abdominal: Soft. There is tenderness (epigastric). There is no rebound and no guarding.       Bowel sounds decreased   Neurological: She is alert and oriented to person, place, and time.  Skin: Skin is warm and dry.  Psychiatric: She has a normal mood and affect. Her behavior is normal.    ED Course  Procedures (including critical care time) DIAGNOSTIC STUDIES: Oxygen Saturation is 96% on room air, normal by my interpretation.    COORDINATION OF CARE: 9:32 AM Discussed ED treatment with pt  9:32 AM Ordered:     . HYDROmorphone  1 mg Intravenous Once  . ondansetron  4 mg Intravenous Once  . pantoprazole  40 mg Intravenous Once        Results for orders placed during the hospital encounter of 03/17/12  CBC WITH DIFFERENTIAL      Component Value Range   WBC 9.0  4.0 - 10.5 K/uL   RBC 5.18 (*) 3.87 - 5.11 MIL/uL   Hemoglobin 16.4 (*) 12.0 - 15.0 g/dL   HCT 11.9 (*) 14.7 - 82.9 %   MCV 91.7  78.0 - 100.0 fL   MCH 31.7  26.0 - 34.0 pg   MCHC 34.5  30.0 - 36.0 g/dL   RDW 56.2  13.0 - 86.5 %   Platelets 302  150 - 400 K/uL   Neutrophils Relative 68  43 - 77 %   Neutro Abs 6.1  1.7 - 7.7 K/uL   Lymphocytes Relative 26  12 - 46 %   Lymphs Abs 2.4  0.7 - 4.0 K/uL   Monocytes Relative 3  3 - 12 %   Monocytes Absolute 0.3  0.1 - 1.0 K/uL   Eosinophils Relative 2  0 - 5 %   Eosinophils Absolute 0.2  0.0 - 0.7 K/uL   Basophils Relative 1  0 - 1 %   Basophils Absolute 0.1  0.0 - 0.1 K/uL  COMPREHENSIVE METABOLIC PANEL      Component Value Range   Sodium 139  135 - 145 mEq/L   Potassium 4.2  3.5 - 5.1 mEq/L   Chloride 102  96 - 112 mEq/L   CO2 26  19 - 32 mEq/L   Glucose, Bld 97  70 - 99 mg/dL   BUN 10  6 - 23 mg/dL   Creatinine, Ser 7.84  0.50 - 1.10 mg/dL   Calcium 69.6 (*) 8.4 - 10.5 mg/dL   Total Protein 8.9 (*) 6.0 - 8.3 g/dL   Albumin 4.6  3.5 - 5.2 g/dL   AST 77 (*) 0 - 37 U/L  ALT 138 (*) 0 - 35 U/L   Alkaline Phosphatase  90  39 - 117 U/L   Total Bilirubin 0.5  0.3 - 1.2 mg/dL   GFR calc non Af Amer >90  >90 mL/min   GFR calc Af Amer >90  >90 mL/min  LIPASE, BLOOD      Component Value Range   Lipase 31  11 - 59 U/L  URINALYSIS, ROUTINE W REFLEX MICROSCOPIC      Component Value Range   Color, Urine YELLOW  YELLOW   APPearance CLOUDY (*) CLEAR   Specific Gravity, Urine 1.020  1.005 - 1.030   pH 5.5  5.0 - 8.0   Glucose, UA NEGATIVE  NEGATIVE mg/dL   Hgb urine dipstick NEGATIVE  NEGATIVE   Bilirubin Urine NEGATIVE  NEGATIVE   Ketones, ur NEGATIVE  NEGATIVE mg/dL   Protein, ur NEGATIVE  NEGATIVE mg/dL   Urobilinogen, UA 0.2  0.0 - 1.0 mg/dL   Nitrite NEGATIVE  NEGATIVE   Leukocytes, UA NEGATIVE  NEGATIVE  TROPONIN I      Component Value Range   Troponin I <0.30  <0.30 ng/mL   Dg Chest Port 1 View  03/17/2012  *RADIOLOGY REPORT*  Clinical Data: Chest pain.  PORTABLE CHEST - 1 VIEW  Comparison: 02/29/2012  Findings: Heart and mediastinal contours are within normal limits. No focal opacities or effusions.  No acute bony abnormality.  IMPRESSION: No active cardiopulmonary disease.   Original Report Authenticated By: Charlett Nose, M.D.       Date: 03/17/2012  Rate: 106  Rhythm: sinus tachycardia  QRS Axis: right  Intervals: normal  ST/T Wave abnormalities: nonspecific ST changes  Conduction Disutrbances:none  Narrative Interpretation: Borderline right axis deviation. When compared with ECG of 02/29/2012, no significant changes are seen.  Old EKG Reviewed: unchanged    1. Abdominal pain   2. Elevated transaminase level       MDM  Epigastric pain radiating to the back consistent with pancreatitis. She has a known history of pancreatitis. Old records have been reviewed and she has been admitted for abdominal pain no which was felt to be due 2 erosive gastritis but has also had ED visits for pancreatitis. Note is again made of a history of chronic pancreatitis. She'll be treated with IV fluids, IV  hydromorphone, IV ondansetron. She will also be given IV pantoprazole.  She required several doses of hydromorphone, but did get good relief of pain. Workup is negative except for mild elevation of transaminase. This will need to be followed as an outpatient. She is discharged with prescriptions for Percocet, metoclopramide, and 10) all and she is to followup with her PCP in the next several days.  I personally performed the services described in this documentation, which was scribed in my presence. The recorded information has been reviewed and is accurate.         Dione Booze, MD 03/17/12 1335

## 2012-03-19 ENCOUNTER — Encounter (HOSPITAL_COMMUNITY): Payer: Self-pay | Admitting: Emergency Medicine

## 2012-03-19 ENCOUNTER — Emergency Department (HOSPITAL_COMMUNITY): Payer: Medicaid Other

## 2012-03-19 ENCOUNTER — Emergency Department (HOSPITAL_COMMUNITY)
Admission: EM | Admit: 2012-03-19 | Discharge: 2012-03-19 | Disposition: A | Discharge status: Home / Self Care | Payer: Medicaid Other | Attending: Emergency Medicine | Admitting: Emergency Medicine

## 2012-03-19 DIAGNOSIS — Z9089 Acquired absence of other organs: Secondary | ICD-10-CM | POA: Insufficient documentation

## 2012-03-19 DIAGNOSIS — Z90710 Acquired absence of both cervix and uterus: Secondary | ICD-10-CM | POA: Insufficient documentation

## 2012-03-19 DIAGNOSIS — G8929 Other chronic pain: Secondary | ICD-10-CM | POA: Insufficient documentation

## 2012-03-19 DIAGNOSIS — K859 Acute pancreatitis without necrosis or infection, unspecified: Secondary | ICD-10-CM | POA: Insufficient documentation

## 2012-03-19 DIAGNOSIS — J449 Chronic obstructive pulmonary disease, unspecified: Secondary | ICD-10-CM | POA: Insufficient documentation

## 2012-03-19 DIAGNOSIS — F17200 Nicotine dependence, unspecified, uncomplicated: Secondary | ICD-10-CM | POA: Insufficient documentation

## 2012-03-19 DIAGNOSIS — IMO0002 Reserved for concepts with insufficient information to code with codable children: Secondary | ICD-10-CM | POA: Insufficient documentation

## 2012-03-19 DIAGNOSIS — Z8544 Personal history of malignant neoplasm of other female genital organs: Secondary | ICD-10-CM | POA: Insufficient documentation

## 2012-03-19 DIAGNOSIS — Z9889 Other specified postprocedural states: Secondary | ICD-10-CM | POA: Insufficient documentation

## 2012-03-19 DIAGNOSIS — J4489 Other specified chronic obstructive pulmonary disease: Secondary | ICD-10-CM | POA: Insufficient documentation

## 2012-03-19 DIAGNOSIS — Z8711 Personal history of peptic ulcer disease: Secondary | ICD-10-CM | POA: Insufficient documentation

## 2012-03-19 DIAGNOSIS — R112 Nausea with vomiting, unspecified: Secondary | ICD-10-CM | POA: Insufficient documentation

## 2012-03-19 DIAGNOSIS — Z79899 Other long term (current) drug therapy: Secondary | ICD-10-CM | POA: Insufficient documentation

## 2012-03-19 DIAGNOSIS — R109 Unspecified abdominal pain: Secondary | ICD-10-CM

## 2012-03-19 HISTORY — DX: Unspecified abdominal pain: R10.9

## 2012-03-19 HISTORY — DX: Other chronic pain: G89.29

## 2012-03-19 LAB — COMPREHENSIVE METABOLIC PANEL
ALT: 135 U/L — ABNORMAL HIGH (ref 0–35)
AST: 86 U/L — ABNORMAL HIGH (ref 0–37)
Albumin: 4.1 g/dL (ref 3.5–5.2)
CO2: 27 mEq/L (ref 19–32)
Chloride: 106 mEq/L (ref 96–112)
GFR calc non Af Amer: >90 mL/min (ref >90)
Sodium: 142 mEq/L (ref 135–145)
Total Bilirubin: 0.4 mg/dL (ref 0.3–1.2)

## 2012-03-19 LAB — CBC WITH DIFFERENTIAL/PLATELET
Basophils Absolute: 0.1 10*3/uL (ref 0.0–0.1)
Lymphocytes Relative: 42 % (ref 12–46)
Neutro Abs: 3.3 10*3/uL (ref 1.7–7.7)
Neutrophils Relative %: 50 % (ref 43–77)
Platelets: 267 10*3/uL (ref 150–400)
RDW: 12.3 % (ref 11.5–15.5)
WBC: 6.4 10*3/uL (ref 4.0–10.5)

## 2012-03-19 LAB — URINALYSIS, ROUTINE W REFLEX MICROSCOPIC
Glucose, UA: NEGATIVE mg/dL
Hgb urine dipstick: NEGATIVE
Ketones, ur: NEGATIVE mg/dL
Protein, ur: NEGATIVE mg/dL
pH: 7.5 (ref 5.0–8.0)

## 2012-03-19 MED ORDER — MORPHINE SULFATE 4 MG/ML IJ SOLN
4.0000 mg | INTRAMUSCULAR | Status: AC | PRN
  Administered 2012-03-19 (×2): 4 mg via INTRAVENOUS
  Filled 2012-03-19 (×2): qty 1

## 2012-03-19 MED ORDER — SODIUM CHLORIDE 0.9 % IV BOLUS (SEPSIS)
500.0000 mL | Freq: Once | INTRAVENOUS | Status: AC
  Administered 2012-03-19: 11:00:00 via INTRAVENOUS

## 2012-03-19 MED ORDER — ONDANSETRON 4 MG PO TBDP: 4.0000 mg | ORAL_TABLET | Freq: Three times a day (TID) | ORAL | Status: DC | PRN

## 2012-03-19 MED ORDER — ONDANSETRON HCL 4 MG/2ML IJ SOLN
4.0000 mg | INTRAMUSCULAR | Status: AC | PRN
  Administered 2012-03-19 (×2): 4 mg via INTRAVENOUS
  Filled 2012-03-19 (×2): qty 2

## 2012-03-19 MED ORDER — SODIUM CHLORIDE 0.9 % IV SOLN
INTRAVENOUS | Status: DC
  Administered 2012-03-19: 11:00:00 via INTRAVENOUS

## 2012-03-19 MED ORDER — FAMOTIDINE IN NACL 20-0.9 MG/50ML-% IV SOLN
20.0000 mg | Freq: Once | INTRAVENOUS | Status: AC
  Administered 2012-03-19: 20 mg via INTRAVENOUS
  Filled 2012-03-19: qty 50

## 2012-03-19 NOTE — ED Notes (Signed)
Patient with c/o epigastric pain x 2 days. Seen in ED for same on 03/17/12. Describes pain as sharp. H/o pancreatitis. States pain is worse.

## 2012-03-19 NOTE — ED Notes (Signed)
PT TOLERATED PO FLUID CHALLENGE WITH NO DIFFICULTIES.

## 2012-03-19 NOTE — ED Provider Notes (Signed)
History     CSN: [id number]  Arrival date & time 03/19/12  0940   First MD Initiated Contact with Patient 03/19/12 [phone]      Chief Complaint  Patient presents with  . Abdominal Pain     HPI Pt was seen at 1015.  Per pt, c/o gradual onset and persistence of constant acute flair of her chronic abd "pain" for the past 2 days. Describes the pain as located in her upper abd area, "sharp," and per her usual chronic pain pattern.  States she has been unable to "keep the rectal phenergan inside me."  Denies diarrhea, no CP/SOB, no back pain, no black or blood in stools or emesis, no fevers.    Past Medical History  Diagnosis Date  . Colitis 11/2010    ?  [name] Lupus   . DDD (degenerative disc disease)   . Pancreatitis 2012    [name]  . Uterine cancer 1996  . Adenomatous polyp of colon 02/2010  . PUD (peptic ulcer disease)   . COPD (chronic obstructive pulmonary disease)   . Chronic abdominal pain     Past Surgical History  Procedure Date  . Abdominal hysterectomy 1996    w/ right salpingoopherectomy  . Appendectomy 1977  . Cholecystectomy 1987  . Cesarean section 1987  . Abdominal exploration surgery 1996    fibroid  . Colonoscopy w/ biopsies 02/2010    Dr Opal Sidles, [place] [facility]  . Esophagogastroduodenoscopy 02/2010    Dr Lamar Laundry  . Shoulder surgery   . Esophagogastroduodenoscopy 09/21/11    Rourk-mild erosive esophagitis, small hiatal hernia, esophageal nodules  . Esophagogastroduodenoscopy 09/20/2011    Procedure: ESOPHAGOGASTRODUODENOSCOPY (EGD);  Surgeon: Corbin Ade, MD;  Location: [facility];  Service: Endoscopy;  Laterality: N/A;  . Multiple extractions with alveoloplasty 03/06/2012    Procedure: MULTIPLE EXTRACION WITH ALVEOLOPLASTY;  Surgeon: [name], DDS;  Location: [facility];  Service: Oral Surgery;  Laterality: Bilateral;  PALATAL EXCISE, PALATAL LESIONS WITH BIOPSY    Family History  Problem Relation Age of Onset  . Ulcers Mother   . Colon cancer  Maternal Grandmother     History  Substance Use Topics  . Smoking status: Current Every Day Smoker -- 0.5 packs/day for 25 years    Types: Cigarettes  . Smokeless tobacco: Not on file  . Alcohol Use: No      Review of Systems ROS: Statement: All systems negative except as marked or noted in the HPI; Constitutional: Negative for fever and chills. ; ; Eyes: Negative for eye pain, redness and discharge. ; ; ENMT: Negative for ear pain, hoarseness, nasal congestion, sinus pressure and sore throat. ; ; Cardiovascular: Negative for chest pain, palpitations, diaphoresis, dyspnea and peripheral edema. ; ; Respiratory: Negative for cough, wheezing and stridor. ; ; Gastrointestinal: +N/V, abd pain. Negative for diarrhea, blood in stool, hematemesis, jaundice and rectal bleeding. . ; ; Genitourinary: Negative for dysuria, flank pain and hematuria. ; ; Musculoskeletal: Negative for back pain and neck pain. Negative for swelling and trauma.; ; Skin: Negative for pruritus, rash, abrasions, blisters, bruising and skin lesion.; ; Neuro: Negative for headache, lightheadedness and neck stiffness. Negative for weakness, altered level of consciousness , altered mental status, extremity weakness, paresthesias, involuntary movement, seizure and syncope.       Allergies  Honey bee venom; Ibuprofen; Ketorolac tromethamine; and Penicillins  Home Medications   Current Outpatient Rx  Name  Route  Sig  Dispense  Refill  . HYDROMORPHONE HCL 2 MG  PO TABS   Oral   Take 1 tablet (2 mg total) by mouth every 4 (four) hours as needed for pain.   30 tablet   0   . METOCLOPRAMIDE HCL 10 MG PO TABS   Oral   Take 1 tablet (10 mg total) by mouth every 6 (six) hours as needed (nausea).   30 tablet   0   . OXYCODONE-ACETAMINOPHEN 5-325 MG PO TABS   Oral   Take 1 tablet by mouth every 4 (four) hours as needed for pain.   20 tablet   0   . PANTOPRAZOLE SODIUM 40 MG PO TBEC   Oral   Take 1 tablet (40 mg total) by  mouth daily.   30 tablet   0     BP 109/59  Pulse 94  Temp 98.2 F (36.8 C) (Oral)  Resp 18  Ht 5\' 8"  (1.727 m)  Wt 145 lb (65.772 kg)  BMI 22.05 kg/m2  SpO2 97%  Physical Exam 1020: Physical examination:  Nursing notes reviewed; Vital signs and O2 SAT reviewed;  Constitutional: Well developed, Well nourished, Well hydrated, Uncomfortable appearing; Head:  Normocephalic, atraumatic; Eyes: EOMI, PERRL, No scleral icterus; ENMT: Mouth and pharynx normal, Mucous membranes moist; Neck: Supple, Full range of motion, No lymphadenopathy; Cardiovascular: Regular rate and rhythm, No murmur, rub, or gallop; Respiratory: Breath sounds clear & equal bilaterally, No rales, rhonchi, wheezes.  Speaking full sentences with ease, Normal respiratory effort/excursion; Chest: Nontender, Movement normal; Abdomen: +dry heaving during my exam. Soft, +mid-epigastric area tenderness to palp. No rebound or guarding. Nondistended, Normal bowel sounds; Genitourinary: No CVA tenderness; Extremities: Pulses normal, No tenderness, No edema, No calf edema or asymmetry.; Neuro: AA&Ox3, Major CN grossly intact.  Speech clear. No gross focal motor or sensory deficits in extremities.; Skin: Color normal, Warm, Dry.   ED Course  Procedures   MDM  MDM Reviewed: previous chart, nursing note and vitals Reviewed previous: labs and ECG Interpretation: labs, ECG and x-ray    Date: 03/19/2012  Rate: 107  Rhythm: sinus tachycardia  QRS Axis: rightward  Intervals: normal  ST/T Wave abnormalities: normal  Conduction Disutrbances:none  Narrative Interpretation:   Old EKG Reviewed: unchanged; no significant changes from previous EKG dated 03/17/2012.  Results for orders placed during the hospital encounter of 03/19/12  CBC WITH DIFFERENTIAL      Component Value Range   WBC 6.4  4.0 - 10.5 K/uL   RBC 4.90  3.87 - 5.11 MIL/uL   Hemoglobin 15.5 (*) 12.0 - 15.0 g/dL   HCT 40.9  81.1 - 91.4 %   MCV 91.6  78.0 - 100.0 fL    MCH 31.6  26.0 - 34.0 pg   MCHC 34.5  30.0 - 36.0 g/dL   RDW 78.2  95.6 - 21.3 %   Platelets 267  150 - 400 K/uL   Neutrophils Relative 50  43 - 77 %   Neutro Abs 3.3  1.7 - 7.7 K/uL   Lymphocytes Relative 42  12 - 46 %   Lymphs Abs 2.7  0.7 - 4.0 K/uL   Monocytes Relative 5  3 - 12 %   Monocytes Absolute 0.3  0.1 - 1.0 K/uL   Eosinophils Relative 3  0 - 5 %   Eosinophils Absolute 0.2  0.0 - 0.7 K/uL   Basophils Relative 1  0 - 1 %   Basophils Absolute 0.1  0.0 - 0.1 K/uL  COMPREHENSIVE METABOLIC PANEL      Component  Value Range   Sodium 142  135 - 145 mEq/L   Potassium 4.6  3.5 - 5.1 mEq/L   Chloride 106  96 - 112 mEq/L   CO2 27  19 - 32 mEq/L   Glucose, Bld 95  70 - 99 mg/dL   BUN 12  6 - 23 mg/dL   Creatinine, Ser 1.19  0.50 - 1.10 mg/dL   Calcium 9.8  8.4 - 14.7 mg/dL   Total Protein 8.2  6.0 - 8.3 g/dL   Albumin 4.1  3.5 - 5.2 g/dL   AST 86 (*) 0 - 37 U/L   ALT 135 (*) 0 - 35 U/L   Alkaline Phosphatase 76  39 - 117 U/L   Total Bilirubin 0.4  0.3 - 1.2 mg/dL   GFR calc non Af Amer >90  >90 mL/min   GFR calc Af Amer >90  >90 mL/min  LIPASE, BLOOD      Component Value Range   Lipase 25  11 - 59 U/L  URINALYSIS, ROUTINE W REFLEX MICROSCOPIC      Component Value Range   Color, Urine YELLOW  YELLOW   APPearance CLEAR  CLEAR   Specific Gravity, Urine 1.010  1.005 - 1.030   pH 7.5  5.0 - 8.0   Glucose, UA NEGATIVE  NEGATIVE mg/dL   Hgb urine dipstick NEGATIVE  NEGATIVE   Bilirubin Urine NEGATIVE  NEGATIVE   Ketones, ur NEGATIVE  NEGATIVE mg/dL   Protein, ur NEGATIVE  NEGATIVE mg/dL   Urobilinogen, UA 1.0  0.0 - 1.0 mg/dL   Nitrite NEGATIVE  NEGATIVE   Leukocytes, UA NEGATIVE  NEGATIVE    Dg Abd Acute W/chest 03/19/2012  *RADIOLOGY REPORT*  Clinical Data: Abdominal pain  ACUTE ABDOMEN SERIES (ABDOMEN 2 VIEW & CHEST 1 VIEW)  Comparison: 03/17/2012  Findings: Cardiomediastinal silhouette is stable.  No acute infiltrate or pleural effusion.  No pulmonary edema.  There is  nonspecific nonobstructive bowel gas pattern.  Surgical clips are noted in the right upper abdomen.  Stool noted throughout the colon.  No free abdominal air.  IMPRESSION: No acute disease.  Nonspecific nonobstructive bowel gas pattern. No free abdominal air.  Stool noted throughout the colon.   Original Report Authenticated By: Natasha Mead, M.D.    Results for NIHIRA, PUELLO (MRN [id number]) as of 03/19/2012 14:29  Ref. Range 12/04/2011 15:55 02/10/2012 03:41 03/17/2012 09:49 03/19/2012 10:38  AST Latest Range: 0-37 U/L 24 64 (H) 77 (H) 86 (H)  ALT Latest Range: 0-35 U/L 28 99 (H) 138 (H) 135 (H)      1425:  Pt has tol PO well while in the ED without N/V.  No stooling while in the ED.  Abd remains benign, VSS. Has ambulated in the ED several times with steady gait.  Wants to go home now.  Dx and testing d/w pt and family.  Questions answered.  Verb understanding, agreeable to d/c home with outpt f/u.             Laray Anger, DO 03/22/12 1311

## 2012-03-20 LAB — URINE CULTURE
Colony Count: NO GROWTH
Culture: NO GROWTH

## 2012-03-25 ENCOUNTER — Emergency Department (HOSPITAL_COMMUNITY)
Admission: EM | Admit: 2012-03-25 | Discharge: 2012-03-25 | Disposition: A | Discharge status: Home / Self Care | Payer: Medicaid Other | Attending: Emergency Medicine | Admitting: Emergency Medicine

## 2012-03-25 ENCOUNTER — Encounter (HOSPITAL_COMMUNITY): Payer: Self-pay

## 2012-03-25 DIAGNOSIS — Z79899 Other long term (current) drug therapy: Secondary | ICD-10-CM | POA: Insufficient documentation

## 2012-03-25 DIAGNOSIS — G8929 Other chronic pain: Secondary | ICD-10-CM | POA: Insufficient documentation

## 2012-03-25 DIAGNOSIS — Z9089 Acquired absence of other organs: Secondary | ICD-10-CM | POA: Insufficient documentation

## 2012-03-25 DIAGNOSIS — IMO0002 Reserved for concepts with insufficient information to code with codable children: Secondary | ICD-10-CM | POA: Insufficient documentation

## 2012-03-25 DIAGNOSIS — Z8544 Personal history of malignant neoplasm of other female genital organs: Secondary | ICD-10-CM | POA: Insufficient documentation

## 2012-03-25 DIAGNOSIS — F17200 Nicotine dependence, unspecified, uncomplicated: Secondary | ICD-10-CM | POA: Insufficient documentation

## 2012-03-25 DIAGNOSIS — J4489 Other specified chronic obstructive pulmonary disease: Secondary | ICD-10-CM | POA: Insufficient documentation

## 2012-03-25 DIAGNOSIS — Z90710 Acquired absence of both cervix and uterus: Secondary | ICD-10-CM | POA: Insufficient documentation

## 2012-03-25 DIAGNOSIS — J449 Chronic obstructive pulmonary disease, unspecified: Secondary | ICD-10-CM | POA: Insufficient documentation

## 2012-03-25 DIAGNOSIS — Z9889 Other specified postprocedural states: Secondary | ICD-10-CM | POA: Insufficient documentation

## 2012-03-25 DIAGNOSIS — R1011 Right upper quadrant pain: Secondary | ICD-10-CM

## 2012-03-25 LAB — CBC WITH DIFFERENTIAL/PLATELET
Basophils Absolute: 0 10*3/uL (ref 0.0–0.1)
Basophils Relative: 0 % (ref 0–1)
Eosinophils Absolute: 0.1 10*3/uL (ref 0.0–0.7)
Eosinophils Relative: 1 % (ref 0–5)
Lymphs Abs: 1.8 10*3/uL (ref 0.7–4.0)
MCH: 31.2 pg (ref 26.0–34.0)
MCHC: 34.2 g/dL (ref 30.0–36.0)
MCV: 91.4 fL (ref 78.0–100.0)
Neutrophils Relative %: 75 % (ref 43–77)
Platelets: 319 10*3/uL (ref 150–400)
RBC: 4.87 MIL/uL (ref 3.87–5.11)
RDW: 12.3 % (ref 11.5–15.5)

## 2012-03-25 LAB — URINALYSIS, ROUTINE W REFLEX MICROSCOPIC
Bilirubin Urine: NEGATIVE
Hgb urine dipstick: NEGATIVE
Specific Gravity, Urine: 1.025 (ref 1.005–1.030)
Urobilinogen, UA: 0.2 mg/dL (ref 0.0–1.0)
pH: 5.5 (ref 5.0–8.0)

## 2012-03-25 LAB — COMPREHENSIVE METABOLIC PANEL
ALT: 108 U/L — ABNORMAL HIGH (ref 0–35)
AST: 49 U/L — ABNORMAL HIGH (ref 0–37)
Albumin: 4.5 g/dL (ref 3.5–5.2)
Alkaline Phosphatase: 77 U/L (ref 39–117)
Calcium: 10.1 mg/dL (ref 8.4–10.5)
GFR calc Af Amer: >90 mL/min (ref >90)
Potassium: 3.7 mEq/L (ref 3.5–5.1)
Sodium: 139 mEq/L (ref 135–145)
Total Protein: 8.2 g/dL (ref 6.0–8.3)

## 2012-03-25 MED ORDER — SODIUM CHLORIDE 0.9 % IV BOLUS (SEPSIS)
1000.0000 mL | Freq: Once | INTRAVENOUS | Status: AC
  Administered 2012-03-25: 1000 mL via INTRAVENOUS

## 2012-03-25 MED ORDER — HYDROMORPHONE HCL PF 1 MG/ML IJ SOLN
1.0000 mg | Freq: Once | INTRAMUSCULAR | Status: AC
  Administered 2012-03-25: 1 mg via INTRAVENOUS
  Filled 2012-03-25: qty 1

## 2012-03-25 MED ORDER — PANTOPRAZOLE SODIUM 40 MG IV SOLR
40.0000 mg | Freq: Once | INTRAVENOUS | Status: AC
  Administered 2012-03-25: 40 mg via INTRAVENOUS
  Filled 2012-03-25: qty 40

## 2012-03-25 MED ORDER — OXYCODONE-ACETAMINOPHEN 5-325 MG PO TABS
1.0000 | ORAL_TABLET | Freq: Once | ORAL | Status: AC
  Administered 2012-03-25: 1 via ORAL
  Filled 2012-03-25: qty 1

## 2012-03-25 MED ORDER — ONDANSETRON HCL 4 MG/2ML IJ SOLN
4.0000 mg | Freq: Once | INTRAMUSCULAR | Status: AC
  Administered 2012-03-25: 4 mg via INTRAVENOUS
  Filled 2012-03-25: qty 2

## 2012-03-25 NOTE — ED Notes (Signed)
Pt reports chest pain that started during the night, +nausea and vomiting, some diarrhea, no fever or cough.

## 2012-03-25 NOTE — ED Provider Notes (Signed)
History   This chart was scribed for Donnetta Hutching, MD, by Frederik Pear, ER scribe. The patient was seen in room APA18/APA18 and the patient's care was started at 1356.    CSN: [id number]  Arrival date & time 03/25/12  1352   First MD Initiated Contact with Patient 03/25/12 1356      Chief Complaint  Patient presents with  . Chest Pain    (Consider location/radiation/quality/duration/timing/severity/associated sxs/prior treatment) HPI Comments: Andrea Nelson is a 46 y.o. female who presents to the Emergency Department complaining of constant, moderate vomiting with associated nausea and diarrhea that began last night. She denies any associated fever. She has a h/o of chronic pancreatitis. Her surgical history includes a cholecystectomy in 1987 and alveoloplasty with multiple extractions performed 03/06/12 by Dr. Leane Para.     Past Medical History  Diagnosis Date  . Colitis 11/2010    ?  [name] Lupus   . DDD (degenerative disc disease)   . Pancreatitis 2012    [name]  . Uterine cancer 1996  . Adenomatous polyp of colon 02/2010  . PUD (peptic ulcer disease)   . COPD (chronic obstructive pulmonary disease)   . Chronic abdominal pain     Past Surgical History  Procedure Date  . Abdominal hysterectomy 1996    w/ right salpingoopherectomy  . Appendectomy 1977  . Cholecystectomy 1987  . Cesarean section 1987  . Abdominal exploration surgery 1996    fibroid  . Colonoscopy w/ biopsies 02/2010    Dr Opal Sidles, [place] [facility]  . Esophagogastroduodenoscopy 02/2010    Dr Lamar Laundry  . Shoulder surgery   . Esophagogastroduodenoscopy 09/21/11    Rourk-mild erosive esophagitis, small hiatal hernia, esophageal nodules  . Esophagogastroduodenoscopy 09/20/2011    Procedure: ESOPHAGOGASTRODUODENOSCOPY (EGD);  Surgeon: Corbin Ade, MD;  Location: [facility];  Service: Endoscopy;  Laterality: N/A;  . Multiple extractions with alveoloplasty 03/06/2012    Procedure: MULTIPLE EXTRACION WITH  ALVEOLOPLASTY;  Surgeon: [name], DDS;  Location: [facility];  Service: Oral Surgery;  Laterality: Bilateral;  PALATAL EXCISE, PALATAL LESIONS WITH BIOPSY    Family History  Problem Relation Age of Onset  . Ulcers Mother   . Colon cancer Maternal Grandmother     History  Substance Use Topics  . Smoking status: Current Every Day Smoker -- 0.5 packs/day for 25 years    Types: Cigarettes  . Smokeless tobacco: Not on file  . Alcohol Use: No    OB History    Grav Para Term Preterm Abortions TAB SAB Ect Mult Living                  Review of Systems A complete 10 system review of systems was obtained and all systems are negative except as noted in the HPI and PMH.   Allergies  Honey bee venom; Ibuprofen; Ketorolac tromethamine; and Penicillins  Home Medications   Current Outpatient Rx  Name  Route  Sig  Dispense  Refill  . ALBUTEROL SULFATE HFA 108 (90 BASE) MCG/ACT IN AERS   Inhalation   Inhale 2 puffs into the lungs every 6 (six) hours as needed. Shortness of Breath         . METOCLOPRAMIDE HCL 10 MG PO TABS   Oral   Take 10 mg by mouth 4 (four) times daily.         [name] PANTOPRAZOLE SODIUM 40 MG PO TBEC   Oral   Take 1 tablet (40 mg total) by mouth daily.  30 tablet   0     BP 119/70  Pulse 106  Temp 98.9 F (37.2 C) (Oral)  Resp 20  SpO2 100%  Physical Exam  Nursing note and vitals reviewed. Constitutional: She is oriented to person, place, and time. She appears well-developed and well-nourished.  HENT:  Head: Normocephalic and atraumatic.  Right Ear: External ear normal.  Left Ear: External ear normal.  Eyes: Conjunctivae normal and EOM are normal. Pupils are equal, round, and reactive to light.  Neck: Normal range of motion and phonation normal. Neck supple.  Cardiovascular: Normal rate, regular rhythm, normal heart sounds and intact distal pulses.   Pulmonary/Chest: Effort normal and breath sounds normal. She exhibits no bony tenderness.    Abdominal: Soft. Normal appearance. There is no tenderness.  Musculoskeletal: Normal range of motion.  Neurological: She is alert and oriented to person, place, and time. She has normal strength. No cranial nerve deficit or sensory deficit. She exhibits normal muscle tone. Coordination normal.  Skin: Skin is warm, dry and intact.  Psychiatric: She has a normal mood and affect. Her behavior is normal. Judgment and thought content normal.    ED Course  Procedures (including critical care time)  DIAGNOSTIC STUDIES: Oxygen Saturation is 100% on room air, normal by my interpretation.    COORDINATION OF CARE:  14:37- Discussed planned course of treatment with the patient, including a pain and nausea medication, UA, and blood work who is agreeable at this time.  14:45- Medication Orders- Hydromorphone (DILAUDID) injection 1 mg-Once, ondansetron (ZOFRAN) injection 4 mg- Once, sodium chloride 0.9% bolus 1,000 mL-Once.  Labs Reviewed  CBC WITH DIFFERENTIAL - Abnormal; Notable for the following:    Hemoglobin 15.2 (*)     All other components within normal limits  COMPREHENSIVE METABOLIC PANEL - Abnormal; Notable for the following:    Glucose, Bld 112 (*)     AST 49 (*)     ALT 108 (*)     All other components within normal limits  URINALYSIS, ROUTINE W REFLEX MICROSCOPIC - Abnormal; Notable for the following:    APPearance HAZY (*)     All other components within normal limits  LIPASE, BLOOD   No results found.   No diagnosis found.    MDM  No acute abdomen at discharge. Feels much better after IV fluids and pain management. Mild elevation of liver functions noted. Lipase normal    I personally performed the services described in this documentation, which was scribed in my presence. The recorded information has been reviewed and is accurate.         Donnetta Hutching, MD 03/25/12 1807

## 2012-03-25 NOTE — ED Notes (Signed)
Dr Cook at bedside

## 2012-03-25 NOTE — ED Provider Notes (Signed)
History     CSN: [id number]  Arrival date & time 03/25/12  1352   First MD Initiated Contact with Patient 03/25/12 1356      Chief Complaint  Patient presents with  . Chest Pain    (Consider location/radiation/quality/duration/timing/severity/associated sxs/prior treatment) HPI...right upper quadrant pain since last evening. Has history of pancreatitis. No alcohol. No chest pain, shortness breath, dyspnea, diarrhea, fever, chills, dysuria. Nothing makes symptoms better or worse. Severity is moderate. No radiation.  Past Medical History  Diagnosis Date  . Colitis 11/2010    ?  [name] Lupus   . DDD (degenerative disc disease)   . Pancreatitis 2012    [name]  . Uterine cancer 1996  . Adenomatous polyp of colon 02/2010  . PUD (peptic ulcer disease)   . COPD (chronic obstructive pulmonary disease)   . Chronic abdominal pain     Past Surgical History  Procedure Date  . Abdominal hysterectomy 1996    w/ right salpingoopherectomy  . Appendectomy 1977  . Cholecystectomy 1987  . Cesarean section 1987  . Abdominal exploration surgery 1996    fibroid  . Colonoscopy w/ biopsies 02/2010    Dr Opal Sidles, [facility]  . Esophagogastroduodenoscopy 02/2010    Dr Lamar Laundry  . Shoulder surgery   . Esophagogastroduodenoscopy 09/21/11    Rourk-mild erosive esophagitis, small hiatal hernia, esophageal nodules  . Esophagogastroduodenoscopy 09/20/2011    Procedure: ESOPHAGOGASTRODUODENOSCOPY (EGD);  Surgeon: Corbin Ade, MD;  Location: [facility];  Service: Endoscopy;  Laterality: N/A;  . Multiple extractions with alveoloplasty 03/06/2012    Procedure: MULTIPLE EXTRACION WITH ALVEOLOPLASTY;  Surgeon: [name], DDS;  Location: [facility];  Service: Oral Surgery;  Laterality: Bilateral;  PALATAL EXCISE, PALATAL LESIONS WITH BIOPSY    Family History  Problem Relation Age of Onset  . Ulcers Mother   . Colon cancer Maternal Grandmother     History  Substance Use Topics  . Smoking status:  Current Every Day Smoker -- 0.5 packs/day for 25 years    Types: Cigarettes  . Smokeless tobacco: Not on file  . Alcohol Use: No    OB History    Grav Para Term Preterm Abortions TAB SAB Ect Mult Living                  Review of Systems  All other systems reviewed and are negative.    Allergies  Honey bee venom; Ibuprofen; Ketorolac tromethamine; and Penicillins  Home Medications   Current Outpatient Rx  Name  Route  Sig  Dispense  Refill  . ALBUTEROL SULFATE HFA 108 (90 BASE) MCG/ACT IN AERS   Inhalation   Inhale 2 puffs into the lungs every 6 (six) hours as needed. Shortness of Breath         . METOCLOPRAMIDE HCL 10 MG PO TABS   Oral   Take 10 mg by mouth 4 (four) times daily.         [name] PANTOPRAZOLE SODIUM 40 MG PO TBEC   Oral   Take 1 tablet (40 mg total) by mouth daily.   30 tablet   0     BP 119/70  Pulse 106  Temp 98.9 F (37.2 C) (Oral)  Resp 20  SpO2 100%  Physical Exam  Nursing note and vitals reviewed. Constitutional: She is oriented to person, place, and time.       Looks dehydrated, in pain  HENT:  Head: Normocephalic and atraumatic.  Eyes: Conjunctivae normal and EOM are normal. Pupils  are equal, round, and reactive to light.  Neck: Normal range of motion. Neck supple.  Cardiovascular: Normal rate, regular rhythm and normal heart sounds.   Pulmonary/Chest: Effort normal and breath sounds normal.  Abdominal: Soft. Bowel sounds are normal.       Minimal tenderness right upper quadrant  Musculoskeletal: Normal range of motion.  Neurological: She is alert and oriented to person, place, and time.  Skin: Skin is warm and dry.  Psychiatric: She has a normal mood and affect.    ED Course  Procedures (including critical care time)  Labs Reviewed  CBC WITH DIFFERENTIAL - Abnormal; Notable for the following:    Hemoglobin 15.2 (*)     All other components within normal limits  COMPREHENSIVE METABOLIC PANEL - Abnormal; Notable for the  following:    Glucose, Bld 112 (*)     AST 49 (*)     ALT 108 (*)     All other components within normal limits  URINALYSIS, ROUTINE W REFLEX MICROSCOPIC - Abnormal; Notable for the following:    APPearance HAZY (*)     All other components within normal limits  LIPASE, BLOOD   No results found.   No diagnosis found.   Date: 03/25/2012  Rate: 96  Rhythm: normal sinus rhythm  QRS Axis: right  Intervals: normal  ST/T Wave abnormalities: normal  Conduction Disutrbances:none  Narrative Interpretation:   Old EKG Reviewed: changes noted   MDM  No acute abdomen. Will hydrate patient check liver functions and lipase   Recheck prior to discharge. Patient feeling much better. Liver functions slightly elevated which is not new. No acute abdomen        Donnetta Hutching, MD 03/26/12 [phone]

## 2012-03-25 NOTE — ED Notes (Signed)
Pt says pain is starting to increase, rates pain at 6.

## 2012-03-26 ENCOUNTER — Other Ambulatory Visit: Payer: Self-pay

## 2012-03-26 ENCOUNTER — Emergency Department (HOSPITAL_COMMUNITY): Payer: 59

## 2012-03-26 ENCOUNTER — Inpatient Hospital Stay (HOSPITAL_COMMUNITY)
Admission: EM | Admit: 2012-03-26 | Discharge: 2012-03-27 | DRG: 897 | Disposition: A | Discharge status: Home / Self Care | Payer: 59 | Attending: Internal Medicine | Admitting: Internal Medicine

## 2012-03-26 ENCOUNTER — Emergency Department (HOSPITAL_COMMUNITY)
Admission: EM | Admit: 2012-03-26 | Discharge: 2012-03-26 | Disposition: A | Discharge status: Home / Self Care | Payer: Medicaid Other | Attending: Emergency Medicine | Admitting: Emergency Medicine

## 2012-03-26 ENCOUNTER — Encounter (HOSPITAL_COMMUNITY): Payer: Self-pay | Admitting: Emergency Medicine

## 2012-03-26 DIAGNOSIS — K279 Peptic ulcer, site unspecified, unspecified as acute or chronic, without hemorrhage or perforation: Secondary | ICD-10-CM | POA: Diagnosis present

## 2012-03-26 DIAGNOSIS — Z90710 Acquired absence of both cervix and uterus: Secondary | ICD-10-CM

## 2012-03-26 DIAGNOSIS — Z23 Encounter for immunization: Secondary | ICD-10-CM

## 2012-03-26 DIAGNOSIS — R1115 Cyclical vomiting syndrome unrelated to migraine: Secondary | ICD-10-CM

## 2012-03-26 DIAGNOSIS — F1120 Opioid dependence, uncomplicated: Secondary | ICD-10-CM | POA: Diagnosis present

## 2012-03-26 DIAGNOSIS — F17200 Nicotine dependence, unspecified, uncomplicated: Secondary | ICD-10-CM | POA: Insufficient documentation

## 2012-03-26 DIAGNOSIS — Z9889 Other specified postprocedural states: Secondary | ICD-10-CM | POA: Insufficient documentation

## 2012-03-26 DIAGNOSIS — R1013 Epigastric pain: Secondary | ICD-10-CM | POA: Diagnosis present

## 2012-03-26 DIAGNOSIS — K3184 Gastroparesis: Secondary | ICD-10-CM | POA: Diagnosis present

## 2012-03-26 DIAGNOSIS — Z8542 Personal history of malignant neoplasm of other parts of uterus: Secondary | ICD-10-CM | POA: Insufficient documentation

## 2012-03-26 DIAGNOSIS — J4489 Other specified chronic obstructive pulmonary disease: Secondary | ICD-10-CM | POA: Insufficient documentation

## 2012-03-26 DIAGNOSIS — J449 Chronic obstructive pulmonary disease, unspecified: Secondary | ICD-10-CM | POA: Insufficient documentation

## 2012-03-26 DIAGNOSIS — Z886 Allergy status to analgesic agent status: Secondary | ICD-10-CM

## 2012-03-26 DIAGNOSIS — Z8719 Personal history of other diseases of the digestive system: Secondary | ICD-10-CM | POA: Insufficient documentation

## 2012-03-26 DIAGNOSIS — Z79899 Other long term (current) drug therapy: Secondary | ICD-10-CM

## 2012-03-26 DIAGNOSIS — F19939 Other psychoactive substance use, unspecified with withdrawal, unspecified: Principal | ICD-10-CM | POA: Diagnosis present

## 2012-03-26 DIAGNOSIS — Z880 Allergy status to penicillin: Secondary | ICD-10-CM

## 2012-03-26 DIAGNOSIS — Z91038 Other insect allergy status: Secondary | ICD-10-CM

## 2012-03-26 DIAGNOSIS — L930 Discoid lupus erythematosus: Secondary | ICD-10-CM | POA: Insufficient documentation

## 2012-03-26 DIAGNOSIS — R109 Unspecified abdominal pain: Secondary | ICD-10-CM

## 2012-03-26 DIAGNOSIS — Z9079 Acquired absence of other genital organ(s): Secondary | ICD-10-CM

## 2012-03-26 DIAGNOSIS — F1220 Cannabis dependence, uncomplicated: Secondary | ICD-10-CM | POA: Diagnosis present

## 2012-03-26 DIAGNOSIS — R1110 Vomiting, unspecified: Secondary | ICD-10-CM

## 2012-03-26 DIAGNOSIS — IMO0002 Reserved for concepts with insufficient information to code with codable children: Secondary | ICD-10-CM | POA: Diagnosis present

## 2012-03-26 DIAGNOSIS — M329 Systemic lupus erythematosus, unspecified: Secondary | ICD-10-CM | POA: Diagnosis present

## 2012-03-26 DIAGNOSIS — E860 Dehydration: Secondary | ICD-10-CM | POA: Diagnosis present

## 2012-03-26 DIAGNOSIS — F1320 Sedative, hypnotic or anxiolytic dependence, uncomplicated: Secondary | ICD-10-CM | POA: Diagnosis present

## 2012-03-26 DIAGNOSIS — F1420 Cocaine dependence, uncomplicated: Secondary | ICD-10-CM | POA: Diagnosis present

## 2012-03-26 HISTORY — DX: Other psychoactive substance abuse, uncomplicated: F19.10

## 2012-03-26 LAB — CBC WITH DIFFERENTIAL/PLATELET
Basophils Absolute: 0.1 10*3/uL (ref 0.0–0.1)
Eosinophils Relative: 1 % (ref 0–5)
HCT: 44.7 % (ref 36.0–46.0)
Hemoglobin: 15.1 g/dL — ABNORMAL HIGH (ref 12.0–15.0)
Lymphocytes Relative: 15 % (ref 12–46)
MCV: 92.7 fL (ref 78.0–100.0)
Monocytes Absolute: 0.5 10*3/uL (ref 0.1–1.0)
Monocytes Relative: 3 % (ref 3–12)
Neutro Abs: 11.2 10*3/uL — ABNORMAL HIGH (ref 1.7–7.7)
RDW: 12.3 % (ref 11.5–15.5)
WBC: 14 10*3/uL — ABNORMAL HIGH (ref 4.0–10.5)

## 2012-03-26 LAB — COMPREHENSIVE METABOLIC PANEL
BUN: 9 mg/dL (ref 6–23)
CO2: 22 mEq/L (ref 19–32)
Calcium: 9.9 mg/dL (ref 8.4–10.5)
Chloride: 106 mEq/L (ref 96–112)
Creatinine, Ser: 0.57 mg/dL (ref 0.50–1.10)
GFR calc Af Amer: >90 mL/min (ref >90)
GFR calc non Af Amer: >90 mL/min (ref >90)
Glucose, Bld: 90 mg/dL (ref 70–99)
Total Bilirubin: 0.6 mg/dL (ref 0.3–1.2)

## 2012-03-26 LAB — LIPASE, BLOOD: Lipase: 26 U/L (ref 11–59)

## 2012-03-26 MED ORDER — ONDANSETRON HCL 4 MG/2ML IJ SOLN
4.0000 mg | Freq: Once | INTRAMUSCULAR | Status: AC
  Administered 2012-03-26: 4 mg via INTRAVENOUS

## 2012-03-26 MED ORDER — SODIUM CHLORIDE 0.9 % IV BOLUS (SEPSIS)
1000.0000 mL | Freq: Once | INTRAVENOUS | Status: AC
  Administered 2012-03-26: 1000 mL via INTRAVENOUS

## 2012-03-26 MED ORDER — ONDANSETRON HCL 4 MG/2ML IJ SOLN
4.0000 mg | Freq: Once | INTRAMUSCULAR | Status: AC
  Administered 2012-03-26: 4 mg via INTRAVENOUS
  Filled 2012-03-26: qty 2

## 2012-03-26 MED ORDER — IOHEXOL 300 MG/ML  SOLN
100.0000 mL | Freq: Once | INTRAMUSCULAR | Status: AC | PRN
  Administered 2012-03-26: 100 mL via INTRAVENOUS

## 2012-03-26 MED ORDER — GI COCKTAIL ~~LOC~~
30.0000 mL | Freq: Once | ORAL | Status: AC
  Administered 2012-03-26: 30 mL via ORAL
  Filled 2012-03-26: qty 30

## 2012-03-26 MED ORDER — HYDROMORPHONE HCL PF 1 MG/ML IJ SOLN
1.0000 mg | Freq: Once | INTRAMUSCULAR | Status: AC
  Administered 2012-03-26: 1 mg via INTRAVENOUS
  Filled 2012-03-26: qty 1

## 2012-03-26 MED ORDER — FAMOTIDINE IN NACL 20-0.9 MG/50ML-% IV SOLN
20.0000 mg | INTRAVENOUS | Status: AC
  Administered 2012-03-26: 20 mg via INTRAVENOUS
  Filled 2012-03-26: qty 50

## 2012-03-26 MED ORDER — PANTOPRAZOLE SODIUM 40 MG PO TBEC
40.0000 mg | DELAYED_RELEASE_TABLET | Freq: Once | ORAL | Status: AC
  Administered 2012-03-26: 40 mg via ORAL
  Filled 2012-03-26: qty 1

## 2012-03-26 MED ORDER — PROMETHAZINE HCL 25 MG PO TABS: 12.5000 mg | ORAL_TABLET | Freq: Four times a day (QID) | ORAL | Status: DC | PRN

## 2012-03-26 MED ORDER — PROMETHAZINE HCL 25 MG/ML IJ SOLN
25.0000 mg | Freq: Once | INTRAMUSCULAR | Status: AC
  Administered 2012-03-26: 25 mg via INTRAVENOUS
  Filled 2012-03-26: qty 1

## 2012-03-26 MED ORDER — HYDROMORPHONE HCL PF 1 MG/ML IJ SOLN
1.0000 mg | Freq: Once | INTRAMUSCULAR | Status: AC
  Administered 2012-03-26: 1 mg via INTRAVENOUS

## 2012-03-26 MED ORDER — HYDROMORPHONE HCL PF 1 MG/ML IJ SOLN
INTRAMUSCULAR | Status: AC
  Filled 2012-03-26: qty 1

## 2012-03-26 MED ORDER — ONDANSETRON HCL 4 MG/2ML IJ SOLN
INTRAMUSCULAR | Status: AC
  Administered 2012-03-26: 4 mg via INTRAVENOUS
  Filled 2012-03-26: qty 2

## 2012-03-26 MED ORDER — PROMETHAZINE HCL 25 MG RE SUPP: 25.0000 mg | Freq: Four times a day (QID) | RECTAL | Status: DC | PRN

## 2012-03-26 MED ORDER — ONDANSETRON HCL 4 MG/2ML IJ SOLN
INTRAMUSCULAR | Status: AC
  Filled 2012-03-26: qty 2

## 2012-03-26 NOTE — ED Provider Notes (Signed)
History   This chart was scribed for [name], MD, by Frederik Pear, ER scribe. The patient was seen in room [facility] and the patient's care was started at 2052.    CSN: [id number]  Arrival date & time 03/26/12  1949   First MD Initiated Contact with Patient 03/26/12 2052      Chief Complaint  Patient presents with  . Nausea  . Emesis  . Abdominal Pain    (Consider location/radiation/quality/duration/timing/severity/associated sxs/prior treatment) HPI Comments: Andrea Nelson is a 46 y.o. female who presents to the Emergency Department complaining of constant, severe recurrent emesis with associated nausea and abdominal pain. She states that she has been having increased abdominal pain since having 14 teeth pulled on 03/06/12. She has been in ED 2X within the past 24 hours with similar complaints. She has a surgical h/o of appendectomy, hysterectomy, and cholecystectomy.   PCP is Dr. Sherril Croon.  GI is Dr. Jena Gauss.   Patient is a 46 y.o. female presenting with vomiting and abdominal pain.  Emesis  This is a recurrent problem. The current episode started more than 1 week ago. The problem occurs continuously. The problem has not changed since onset.There has been no fever. Associated symptoms include abdominal pain. Pertinent negatives include no cough, no diarrhea and no headaches.  Abdominal Pain The primary symptoms of the illness include abdominal pain, nausea and vomiting. The primary symptoms of the illness do not include fatigue or diarrhea. The current episode started more than 2 days ago. The problem has not changed since onset. The patient states that she believes she is currently not pregnant. The patient has had a change in bowel habit. Risk factors for an acute abdominal problem include a history of abdominal surgery. Symptoms associated with the illness do not include hematuria, frequency or back pain.    Past Medical History  Diagnosis Date  . Colitis 11/2010   ?  [name] Lupus   . DDD (degenerative disc disease)   . Pancreatitis 2012    [name]  . Uterine cancer 1996  . Adenomatous polyp of colon 02/2010  . PUD (peptic ulcer disease)   . COPD (chronic obstructive pulmonary disease)   . Chronic abdominal pain     Past Surgical History  Procedure Date  . Abdominal hysterectomy 1996    w/ right salpingoopherectomy  . Appendectomy 1977  . Cholecystectomy 1987  . Cesarean section 1987  . Abdominal exploration surgery 1996    fibroid  . Colonoscopy w/ biopsies 02/2010    Dr Opal Sidles, [facility]  . Esophagogastroduodenoscopy 02/2010    Dr Lamar Laundry  . Shoulder surgery   . Esophagogastroduodenoscopy 09/21/11    Rourk-mild erosive esophagitis, small hiatal hernia, esophageal nodules  . Esophagogastroduodenoscopy 09/20/2011    Procedure: ESOPHAGOGASTRODUODENOSCOPY (EGD);  Surgeon: Corbin Ade, MD;  Location: [facility];  Service: Endoscopy;  Laterality: N/A;  . Multiple extractions with alveoloplasty 03/06/2012    Procedure: MULTIPLE EXTRACION WITH ALVEOLOPLASTY;  Surgeon: [name], DDS;  Location: [facility];  Service: Oral Surgery;  Laterality: Bilateral;  PALATAL EXCISE, PALATAL LESIONS WITH BIOPSY    Family History  Problem Relation Age of Onset  . Ulcers Mother   . Colon cancer Maternal Grandmother     History  Substance Use Topics  . Smoking status: Current Every Day Smoker -- 0.5 packs/day for 25 years    Types: Cigarettes  . Smokeless tobacco: Not on file  . Alcohol Use: No    OB History  Grav Para Term Preterm Abortions TAB SAB Ect Mult Living                  Review of Systems  Constitutional: Negative for fatigue.  HENT: Negative for congestion, sinus pressure and ear discharge.   Eyes: Negative for discharge.  Respiratory: Negative for cough.   Cardiovascular: Negative for chest pain.  Gastrointestinal: Positive for nausea, vomiting, abdominal pain and blood in stool. Negative for diarrhea.  Genitourinary:  Negative for frequency and hematuria.  Musculoskeletal: Negative for back pain.  Skin: Negative for rash.  Neurological: Negative for seizures and headaches.  Hematological: Negative.   Psychiatric/Behavioral: Negative for hallucinations.  All other systems reviewed and are negative.    Allergies  Honey bee venom; Ibuprofen; Ketorolac tromethamine; and Penicillins  Home Medications   Current Outpatient Rx  Name  Route  Sig  Dispense  Refill  . METOCLOPRAMIDE HCL 10 MG PO TABS   Oral   Take 10 mg by mouth 4 (four) times daily.         [name] PANTOPRAZOLE SODIUM 40 MG PO TBEC   Oral   Take 1 tablet (40 mg total) by mouth daily.   30 tablet   0   . PROMETHAZINE HCL 25 MG PO TABS   Oral   Take 0.5 tablets (12.5 mg total) by mouth every 6 (six) hours as needed for nausea.   10 tablet   0   . ALBUTEROL SULFATE HFA 108 (90 BASE) MCG/ACT IN AERS   Inhalation   Inhale 2 puffs into the lungs every 6 (six) hours as needed. Shortness of Breath         . PROMETHAZINE HCL 25 MG RE SUPP   Rectal   Place 1 suppository (25 mg total) rectally every 6 (six) hours as needed for nausea.   12 each   0     BP 127/110  Pulse 98  Temp 98.5 F (36.9 C) (Oral)  Resp 20  Ht 5\' 8"  (1.727 m)  Wt 145 lb (65.772 kg)  BMI 22.05 kg/m2  SpO2 100%  Physical Exam  Nursing note and vitals reviewed. Constitutional: She is oriented to person, place, and time. She appears well-developed.  HENT:  Head: Normocephalic and atraumatic.  Eyes: Conjunctivae normal and EOM are normal. No scleral icterus.  Neck: Neck supple. No thyromegaly present.  Cardiovascular: Normal rate and regular rhythm.  Exam reveals no gallop and no friction rub.   No murmur heard. Pulmonary/Chest: No stridor. She has no wheezes. She has no rales. She exhibits no tenderness.  Abdominal: She exhibits no distension. There is no tenderness. There is no rebound.       She has moderate epigastric tenderness.  Musculoskeletal:  Normal range of motion. She exhibits no edema.  Lymphadenopathy:    She has no cervical adenopathy.  Neurological: She is oriented to person, place, and time. Coordination normal.  Skin: No rash noted. No erythema.  Psychiatric: She has a normal mood and affect. Her behavior is normal.    ED Course  Procedures (including critical care time)  DIAGNOSTIC STUDIES: Oxygen Saturation is 100% on room air, normal by my interpretation.    COORDINATION OF CARE:  21:08- Discussed planned course of treatment with the patient, including pain medication, a pelvic CT, and blood work,  who is agreeable at this time.  21:15- Medication Orders- ondansetron (ZOFRAN) injection 4 mg-Once, sodium chloride 0.9% bolus 1,000 mL-Once, hydromorphone (DILAUDID) injection 1 mg-Once.  22:00- Medication Orders-  ondansetron (ZOFRAN) injection 4 mg -Once.  22:30- Medication Orders- Hydromorphone (Dilaudid) injection 1 mg- Once.  Results for orders placed during the hospital encounter of 03/26/12  CBC WITH DIFFERENTIAL      Component Value Range   WBC 14.0 (*) 4.0 - 10.5 K/uL   RBC 4.82  3.87 - 5.11 MIL/uL   Hemoglobin 15.1 (*) 12.0 - 15.0 g/dL   HCT 16.1  09.6 - 04.5 %   MCV 92.7  78.0 - 100.0 fL   MCH 31.3  26.0 - 34.0 pg   MCHC 33.8  30.0 - 36.0 g/dL   RDW 40.9  81.1 - 91.4 %   Platelets 276  150 - 400 K/uL   Neutrophils Relative 80 (*) 43 - 77 %   Neutro Abs 11.2 (*) 1.7 - 7.7 K/uL   Lymphocytes Relative 15  12 - 46 %   Lymphs Abs 2.2  0.7 - 4.0 K/uL   Monocytes Relative 3  3 - 12 %   Monocytes Absolute 0.5  0.1 - 1.0 K/uL   Eosinophils Relative 1  0 - 5 %   Eosinophils Absolute 0.1  0.0 - 0.7 K/uL   Basophils Relative 0  0 - 1 %   Basophils Absolute 0.1  0.0 - 0.1 K/uL  COMPREHENSIVE METABOLIC PANEL      Component Value Range   Sodium 141  135 - 145 mEq/L   Potassium 3.8  3.5 - 5.1 mEq/L   Chloride 106  96 - 112 mEq/L   CO2 22  19 - 32 mEq/L   Glucose, Bld 90  70 - 99 mg/dL   BUN 9  6 - 23  mg/dL   Creatinine, Ser 7.82  0.50 - 1.10 mg/dL   Calcium 9.9  8.4 - 95.6 mg/dL   Total Protein 7.9  6.0 - 8.3 g/dL   Albumin 4.4  3.5 - 5.2 g/dL   AST 36  0 - 37 U/L   ALT 81 (*) 0 - 35 U/L   Alkaline Phosphatase 75  39 - 117 U/L   Total Bilirubin 0.6  0.3 - 1.2 mg/dL   GFR calc non Af Amer >90  >90 mL/min   GFR calc Af Amer >90  >90 mL/min  LIPASE, BLOOD      Component Value Range   Lipase 26  11 - 59 U/L     Labs Reviewed - No data to display No results found.   No diagnosis found.    MDM    The chart was scribed for me under my direct supervision.  I personally performed the history, physical, and medical decision making and all procedures in the evaluation of this patient.[name], MD 03/27/12 [phone]

## 2012-03-26 NOTE — ED Notes (Signed)
Pt c/o epigastric pain with N/V. Recurring pain was seen here this am for same.

## 2012-03-26 NOTE — ED Provider Notes (Signed)
History     CSN: [id number]  Arrival date & time 03/26/12  0400   First MD Initiated Contact with Patient 03/26/12 [phone]      Chief Complaint  Patient presents with  . Chest Pain  . Emesis    (Consider location/radiation/quality/duration/timing/severity/associated sxs/prior treatment) HPI  Andrea Nelson is a 46 y.o. female who presents to the Emergency Department complaining of persistent nausea, vomiting and epigastric pain. Patient states she has been having increased abdominal pain since having 14 teeth pulled on 03/06/12. She was in the ER yesterday with similar complaints. She takes a PPI. She is followed by Dr. Jena Gauss, GI for pancreatitis. Her lipase has been in the normal range the last few visits to the ER. She has seen her PCP, Dr. Sherril Croon who has advised her to follow up with Dr. Jena Gauss.   PCP Dr. Sherril Croon GI Dr Jena Gauss     Past Medical History  Diagnosis Date  . Colitis 11/2010    ?  [name] Lupus   . DDD (degenerative disc disease)   . Pancreatitis 2012    [name]  . Uterine cancer 1996  . Adenomatous polyp of colon 02/2010  . PUD (peptic ulcer disease)   . COPD (chronic obstructive pulmonary disease)   . Chronic abdominal pain     Past Surgical History  Procedure Date  . Abdominal hysterectomy 1996    w/ right salpingoopherectomy  . Appendectomy 1977  . Cholecystectomy 1987  . Cesarean section 1987  . Abdominal exploration surgery 1996    fibroid  . Colonoscopy w/ biopsies 02/2010    Dr Opal Sidles, [place] [facility]  . Esophagogastroduodenoscopy 02/2010    Dr Lamar Laundry  . Shoulder surgery   . Esophagogastroduodenoscopy 09/21/11    Rourk-mild erosive esophagitis, small hiatal hernia, esophageal nodules  . Esophagogastroduodenoscopy 09/20/2011    Procedure: ESOPHAGOGASTRODUODENOSCOPY (EGD);  Surgeon: Corbin Ade, MD;  Location: [facility];  Service: Endoscopy;  Laterality: N/A;  . Multiple extractions with alveoloplasty 03/06/2012    Procedure: MULTIPLE EXTRACION WITH  ALVEOLOPLASTY;  Surgeon: [name], DDS;  Location: [facility];  Service: Oral Surgery;  Laterality: Bilateral;  PALATAL EXCISE, PALATAL LESIONS WITH BIOPSY    Family History  Problem Relation Age of Onset  . Ulcers Mother   . Colon cancer Maternal Grandmother     History  Substance Use Topics  . Smoking status: Current Every Day Smoker -- 0.5 packs/day for 25 years    Types: Cigarettes  . Smokeless tobacco: Not on file  . Alcohol Use: No    OB History    Grav Para Term Preterm Abortions TAB SAB Ect Mult Living                  Review of Systems  Constitutional: Negative for fever.       10 Systems reviewed and are negative for acute change except as noted in the HPI.  HENT: Negative for congestion.   Eyes: Negative for discharge and redness.  Respiratory: Negative for cough and shortness of breath.   Cardiovascular: Negative for chest pain.  Gastrointestinal: Positive for nausea, vomiting and abdominal pain.  Musculoskeletal: Negative for back pain.  Skin: Negative for rash.  Neurological: Negative for syncope, numbness and headaches.  Psychiatric/Behavioral:       No behavior change.    Allergies  Honey bee venom; Ibuprofen; Ketorolac tromethamine; and Penicillins  Home Medications   Current Outpatient Rx  Name  Route  Sig  Dispense  Refill  .  ALBUTEROL SULFATE HFA 108 (90 BASE) MCG/ACT IN AERS   Inhalation   Inhale 2 puffs into the lungs every 6 (six) hours as needed. Shortness of Breath         . METOCLOPRAMIDE HCL 10 MG PO TABS   Oral   Take 10 mg by mouth 4 (four) times daily.         [name] PANTOPRAZOLE SODIUM 40 MG PO TBEC   Oral   Take 1 tablet (40 mg total) by mouth daily.   30 tablet   0     BP 143/80  Pulse 66  Resp 16  Ht 5\' 8"  (1.727 m)  Wt 145 lb (65.772 kg)  BMI 22.05 kg/m2  SpO2 100%  Physical Exam  Nursing note and vitals reviewed. Constitutional: She appears well-developed and well-nourished.       Awake, alert, nontoxic  appearance.  HENT:  Head: Atraumatic.  Eyes: Right eye exhibits no discharge. Left eye exhibits no discharge.  Neck: Neck supple.  Cardiovascular: Normal heart sounds.   Pulmonary/Chest: Effort normal and breath sounds normal. She exhibits no tenderness.  Abdominal: Soft. There is tenderness. There is no rebound.       Mild epigastric tenderness to palpation  Musculoskeletal: She exhibits no tenderness.       Baseline ROM, no obvious new focal weakness.  Neurological:       Mental status and motor strength appears baseline for patient and situation.  Skin: No rash noted.  Psychiatric: She has a normal mood and affect.    ED Course  Procedures (including critical care time)   Date: 03/26/2012  0412  Rate: 65  Rhythm: normal sinus rhythm with sinus arrhythmia  QRS Axis: normal  Intervals: normal  ST/T Wave abnormalities: normal  Conduction Disutrbances: none  Narrative Interpretation: unremarkable    545 Patient states that while GI cocktail worked a little bit her pain is still intense. She was given Dilaudid yesterday for her pain.Ordered dilaudid 1 mg IV. Encouraged her to follow up with Dr. Jena Gauss on Monday. MDM  Patient with recurrent abdominal pain, nausea and vomiting  2 weeks. Labs done yesterday were normal except for slightly elevated LFTs which are baseline for the patient. Lipase was normal. Gien PPI, pepcid, GI cocktail with mild relief. Analgesic given. Encouraged to follow up with GI.Pt stable in ED with no significant deterioration in condition.The patient appears reasonably screened and/or stabilized for discharge and I doubt any other medical condition or other [facility] requiring further screening, evaluation, or treatment in the ED at this time prior to discharge.  MDM Reviewed: previous chart, nursing note and vitals Reviewed previous: labs, x-ray and CT scan           Nicoletta Dress. Colon Branch, MD 03/26/12 [phone]

## 2012-03-26 NOTE — ED Notes (Signed)
Pt states she has gotten worse since leaving this morning. Pt was instructed to follow up w/ dr Jena Gauss.

## 2012-03-26 NOTE — ED Notes (Signed)
Patient transported to CT 

## 2012-03-26 NOTE — ED Notes (Signed)
Patient complaining of centralized chest pain/pain in epigastric area. Also reports nausea and vomiting. States started Saturday morning, seen here yesterday for same symptoms.

## 2012-03-27 ENCOUNTER — Encounter (HOSPITAL_COMMUNITY): Payer: Self-pay | Admitting: Internal Medicine

## 2012-03-27 DIAGNOSIS — R1013 Epigastric pain: Secondary | ICD-10-CM

## 2012-03-27 DIAGNOSIS — F1910 Other psychoactive substance abuse, uncomplicated: Secondary | ICD-10-CM | POA: Insufficient documentation

## 2012-03-27 DIAGNOSIS — R1110 Vomiting, unspecified: Secondary | ICD-10-CM

## 2012-03-27 DIAGNOSIS — R1115 Cyclical vomiting syndrome unrelated to migraine: Secondary | ICD-10-CM

## 2012-03-27 DIAGNOSIS — E860 Dehydration: Secondary | ICD-10-CM

## 2012-03-27 DIAGNOSIS — R109 Unspecified abdominal pain: Secondary | ICD-10-CM

## 2012-03-27 LAB — CBC
Hemoglobin: 14.4 g/dL (ref 12.0–15.0)
Platelets: 221 10*3/uL (ref 150–400)
RBC: 4.7 MIL/uL (ref 3.87–5.11)

## 2012-03-27 LAB — BASIC METABOLIC PANEL
Calcium: 9.1 mg/dL (ref 8.4–10.5)
GFR calc Af Amer: >90 mL/min (ref >90)
GFR calc non Af Amer: >90 mL/min (ref >90)
Potassium: 3.5 mEq/L (ref 3.5–5.1)
Sodium: 138 mEq/L (ref 135–145)

## 2012-03-27 LAB — TSH: TSH: 0.053 u[IU]/mL — ABNORMAL LOW (ref 0.350–4.500)

## 2012-03-27 LAB — RAPID URINE DRUG SCREEN, HOSP PERFORMED
Cocaine: POSITIVE — AB
Opiates: POSITIVE — AB
Tetrahydrocannabinol: POSITIVE — AB

## 2012-03-27 LAB — MRSA PCR SCREENING: MRSA by PCR: NEGATIVE

## 2012-03-27 MED ORDER — PANTOPRAZOLE SODIUM 40 MG IV SOLR
INTRAVENOUS | Status: AC
  Filled 2012-03-27: qty 80

## 2012-03-27 MED ORDER — ALBUTEROL SULFATE HFA 108 (90 BASE) MCG/ACT IN AERS: 2.0000 | INHALATION_SPRAY | Freq: Four times a day (QID) | RESPIRATORY_TRACT | Status: DC | PRN

## 2012-03-27 MED ORDER — PROMETHAZINE HCL 25 MG/ML IJ SOLN
25.0000 mg | Freq: Four times a day (QID) | INTRAMUSCULAR | Status: DC | PRN
  Administered 2012-03-27: 25 mg via INTRAVENOUS
  Filled 2012-03-27: qty 1

## 2012-03-27 MED ORDER — SODIUM CHLORIDE 0.9 % IV BOLUS (SEPSIS)
1000.0000 mL | Freq: Once | INTRAVENOUS | Status: AC
  Administered 2012-03-27: 1000 mL via INTRAVENOUS

## 2012-03-27 MED ORDER — PANTOPRAZOLE SODIUM 40 MG IV SOLR: 40.0000 mg | Freq: Two times a day (BID) | INTRAVENOUS | Status: DC

## 2012-03-27 MED ORDER — POTASSIUM CHLORIDE IN NACL 20-0.9 MEQ/L-% IV SOLN
INTRAVENOUS | Status: DC
  Administered 2012-03-27: 04:00:00 via INTRAVENOUS

## 2012-03-27 MED ORDER — BISACODYL 10 MG RE SUPP: 10.0000 mg | Freq: Every day | RECTAL | Status: DC | PRN

## 2012-03-27 MED ORDER — ONDANSETRON HCL 4 MG/2ML IJ SOLN
4.0000 mg | INTRAMUSCULAR | Status: DC | PRN
  Administered 2012-03-27: 4 mg via INTRAVENOUS
  Filled 2012-03-27: qty 2

## 2012-03-27 MED ORDER — PROMETHAZINE HCL 12.5 MG PO TABS
12.5000 mg | ORAL_TABLET | Freq: Once | ORAL | Status: AC
  Administered 2012-03-27: 12.5 mg via ORAL
  Filled 2012-03-27: qty 1

## 2012-03-27 MED ORDER — METOCLOPRAMIDE HCL 5 MG/ML IJ SOLN
10.0000 mg | Freq: Four times a day (QID) | INTRAMUSCULAR | Status: DC
  Administered 2012-03-27: 10 mg via INTRAVENOUS
  Filled 2012-03-27: qty 2

## 2012-03-27 MED ORDER — TRAZODONE HCL 50 MG PO TABS: 25.0000 mg | ORAL_TABLET | Freq: Every evening | ORAL | Status: DC | PRN

## 2012-03-27 MED ORDER — SODIUM CHLORIDE 0.9 % IV SOLN
80.0000 mg | Freq: Once | INTRAVENOUS | Status: AC
  Administered 2012-03-27: 80 mg via INTRAVENOUS
  Filled 2012-03-27: qty 80

## 2012-03-27 MED ORDER — PROMETHAZINE HCL 25 MG PO TABS: 12.5000 mg | ORAL_TABLET | Freq: Four times a day (QID) | ORAL | Status: AC | PRN

## 2012-03-27 MED ORDER — FLEET ENEMA 7-19 GM/118ML RE ENEM: 1.0000 | ENEMA | Freq: Once | RECTAL | Status: DC | PRN

## 2012-03-27 MED ORDER — SODIUM CHLORIDE 0.9 % IV BOLUS (SEPSIS): 1000.0000 mL | Freq: Once | INTRAVENOUS | Status: DC

## 2012-03-27 MED ORDER — HYDROMORPHONE HCL PF 1 MG/ML IJ SOLN
0.5000 mg | Freq: Once | INTRAMUSCULAR | Status: AC
  Administered 2012-03-27: 0.5 mg via INTRAVENOUS
  Filled 2012-03-27: qty 1

## 2012-03-27 MED ORDER — IPRATROPIUM BROMIDE 0.02 % IN SOLN: 0.5000 mg | Freq: Four times a day (QID) | RESPIRATORY_TRACT | Status: DC | PRN

## 2012-03-27 NOTE — H&P (Signed)
[facility] History and Physical  Andrea Nelson  ZOX:[id number]  DOB: [birth year]   DOA: 03/27/2012   PCP:   Ignatius Specking., MD   Chief Complaint:  Vomiting on and off for the past week  HPI: Andrea Nelson is an 46 y.o. female.  Caucasian lady with a history of chronic nausea vomiting and abdominal pain of unclear etiology, who had an EGD done in May showed no evidence of significant problems, but did have positive gastric emptying study done, though she seems to her been on narcotics at the time. She has been placed on Protonix and Reglan.  Patient had multiple dental extractions October 28, and was placed on Dilaudid tablets for pain; patient says she has not had any pain medications since then, but review of [place] narcotic prescribing websites shows that she was subsequent given Percocet tablets by 2 different subscribers, and when they ran out patient says she's been having abdominal pain and persistent vomiting since then.  In the emergency room patient has been having abdominal pain and vomiting which she says is helped by Dilaudid Zofran and Phenergan, but because the vomiting has persisted in the emergency room the hospitalist service was called to assist.  Patient is insisting that his pain which is triggering the vomiting, but she also feels she has something wrong with her stomach.  No history of fever cough or cold or other symptoms.  Reportedly has a history of SLE and is due to start a new treatment with Dr. Zenovia Jordan at the end of the month. Rewiew of Systems:   All systems negative except as marked bold or noted in the HPI;  Constitutional: Negative for malaise, fever and chills. ;  Eyes: Negative for eye pain, redness and discharge. ;  ENMT: Negative for ear pain, hoarseness, nasal congestion, sinus pressure and sore throat. ;  Cardiovascular: Negative for chest pain, palpitations, diaphoresis, dyspnea and peripheral edema. ;  Respiratory: Negative  for cough, hemoptysis, wheezing and stridor. ;  Gastrointestinal: Negative for nausea, vomiting, diarrhea, constipation, abdominal pain, melena, blood in stool, hematemesis, jaundice and rectal bleeding. unusual weight loss..   Genitourinary: Negative for frequency, dysuria, incontinence,flank pain and hematuria; Musculoskeletal: Negative for back pain and neck pain. Negative for swelling and trauma.;  Skin: . Negative for pruritus, rash, abrasions, bruising and skin lesion.; ulcerations Neuro: Negative for headache, lightheadedness and neck stiffness. Negative for weakness, altered level of consciousness , altered mental status, extremity weakness, burning feet, involuntary movement, seizure and syncope.  Psych: negative for anxiety, depression, insomnia, tearfulness, panic attacks, hallucinations, paranoia, suicidal or homicidal ideation    Past Medical History  Diagnosis Date  . Colitis 11/2010    ?  [name] Lupus   . DDD (degenerative disc disease)   . Pancreatitis 2012    [name]  . Uterine cancer 1996  . Adenomatous polyp of colon 02/2010  . PUD (peptic ulcer disease)   . COPD (chronic obstructive pulmonary disease)   . Chronic abdominal pain     Past Surgical History  Procedure Date  . Abdominal hysterectomy 1996    w/ right salpingoopherectomy  . Appendectomy 1977  . Cholecystectomy 1987  . Cesarean section 1987  . Abdominal exploration surgery 1996    fibroid  . Colonoscopy w/ biopsies 02/2010    Dr Opal Sidles, [place] [facility]  . Esophagogastroduodenoscopy 02/2010    Dr Lamar Laundry  . Shoulder surgery   . Esophagogastroduodenoscopy 09/21/11    Rourk-mild erosive esophagitis, small hiatal hernia, esophageal nodules  .  Esophagogastroduodenoscopy 09/20/2011    Procedure: ESOPHAGOGASTRODUODENOSCOPY (EGD);  Surgeon: Corbin Ade, MD;  Location: [facility];  Service: Endoscopy;  Laterality: N/A;  . Multiple extractions with alveoloplasty 03/06/2012    Procedure: MULTIPLE EXTRACION WITH  ALVEOLOPLASTY;  Surgeon: [name], DDS;  Location: [facility] OR;  Service: Oral Surgery;  Laterality: Bilateral;  PALATAL EXCISE, PALATAL LESIONS WITH BIOPSY    Medications:  HOME MEDS: Prior to Admission medications   Medication Sig Start Date End Date Taking? Authorizing Provider  metoCLOPramide (REGLAN) 10 MG tablet Take 10 mg by mouth 4 (four) times daily. 03/17/12  Yes Dione Booze, MD  pantoprazole (PROTONIX) 40 MG tablet Take 1 tablet (40 mg total) by mouth daily. 03/17/12  Yes Dione Booze, MD  promethazine (PHENERGAN) 25 MG tablet Take 0.5 tablets (12.5 mg total) by mouth every 6 (six) hours as needed for nausea. 03/26/12 04/02/12 Yes Terry S. Colon Branch, MD  albuterol (PROVENTIL HFA;VENTOLIN HFA) 108 (90 BASE) MCG/ACT inhaler Inhale 2 puffs into the lungs every 6 (six) hours as needed. Shortness of Breath    Historical Provider, MD  promethazine (PHENERGAN) 25 MG suppository Place 1 suppository (25 mg total) rectally every 6 (six) hours as needed for nausea. 03/26/12   Nicoletta Dress. Colon Branch, MD     Allergies:  Allergies  Allergen Reactions  . Honey Bee Venom Anaphylaxis  . Ibuprofen Anaphylaxis    Mouth swelling and itching  . Ketorolac Tromethamine Hives    Blisters near injection site  . Penicillins Hives and Swelling    Social History:   reports that she has been smoking Cigarettes.  She has a 12.5 pack-year smoking history. She does not have any smokeless tobacco history on file. She reports that she does not drink alcohol or use illicit drugs. Reports she used to smoke one pack per day but discontinued 5 weeks ago.  Family History: Family History  Problem Relation Age of Onset  . Ulcers Mother   . Colon cancer Maternal Grandmother   . CAD Mother   . Cancer - Lung Father 56  . Hypertension Mother      Physical Exam: Filed Vitals:   03/26/12 2000 03/26/12 2215  BP: 127/110 142/79  Pulse: 98 70  Temp: 98.5 F (36.9 C)   TempSrc: Oral   Resp: 20 22  Height: 5\' 8"  (1.727  m)   Weight: 65.772 kg (145 lb)   SpO2: 100% 99%   Blood pressure 142/79, pulse 70, temperature 98.5 F (36.9 C), temperature source Oral, resp. rate 22, height 5\' 8"  (1.727 m), weight 65.772 kg (145 lb), SpO2 99.00%.  GEN:  Pleasant Caucasian lady sitting up in the stretcher rest less and complaining of pain; cooperative with exam; does not appear to be in pain when discussing her history or when being examined, but intermittently interrupt his activities to verbally complain of pain. PSYCH:  alert and oriented x4; appears anxious; affect is appropriate. HEENT: Mucous membranes pink, dry and anicteric; PERRLA; EOM intact; no cervical lymphadenopathy nor thyromegaly or carotid bruit; no JVD; edentulous Breasts:: Not examined CHEST WALL: No tenderness CHEST: Normal respiration, occasional rhonchi bilaterally HEART: Regular rate and rhythm; no murmurs rubs or gallops BACK: No kyphosis or scoliosis; no CVA tenderness ABDOMEN: Scaphoid, soft no significant tenderness elicited; no masses, no organomegaly, normal abdominal bowel sounds; no pannus; no intertriginous candida. Rectal Exam: Not done EXTREMITIES: No bone or joint deformity;; no edema; no ulcerations. Genitalia: not examined PULSES: 2+ and symmetric SKIN: Normal hydration no rash  or ulceration CNS: Cranial nerves 2-12 grossly intact no focal lateralizing neurologic deficit   Labs on Admission:  Basic Metabolic Panel:  Lab 03/26/12 1610 03/25/12 1515  NA 141 139  K 3.8 3.7  CL 106 102  CO2 22 25  GLUCOSE 90 112*  BUN 9 9  CREATININE 0.57 0.61  CALCIUM 9.9 10.1  MG -- --  PHOS -- --   Liver Function Tests:  Lab 03/26/12 2050 03/25/12 1515  AST 36 49*  ALT 81* 108*  ALKPHOS 75 77  BILITOT 0.6 0.6  PROT 7.9 8.2  ALBUMIN 4.4 4.5    Lab 03/26/12 2050 03/25/12 1515  LIPASE 26 19  AMYLASE -- --   No results found for this basename: AMMONIA:5 in the last 168 hours CBC:  Lab 03/26/12 2050 03/25/12 1515  WBC 14.0*  9.0  NEUTROABS 11.2* 6.7  HGB 15.1* 15.2*  HCT 44.7 44.5  MCV 92.7 91.4  PLT 276 319   Cardiac Enzymes: No results found for this basename: CKTOTAL:5,CKMB:5,CKMBINDEX:5,TROPONINI:5 in the last 168 hours BNP: No components found with this basename: POCBNP:5 D-dimer: No components found with this basename: D-DIMER:5 CBG: No results found for this basename: GLUCAP:5 in the last 168 hours  Radiological Exams on Admission: Ct Abdomen Pelvis W Contrast  03/27/2012  *RADIOLOGY REPORT*  Clinical Data: Epigastric abdominal pain  CT ABDOMEN AND PELVIS WITH CONTRAST  Technique:  Multidetector CT imaging of the abdomen and pelvis was performed following the standard protocol during bolus administration of intravenous contrast.  Contrast: OMNIPAQUE IOHEXOL 300 MG/ML  SOLN  Comparison: 03/19/2012 radiographs, 09/19/2011 CT  Findings: Limited images through the lung bases demonstrate no significant appreciable abnormality. The heart size is within normal limits. No pleural or pericardial effusion.  Unremarkable liver, spleen, pancreas, adrenal glands.  Status post cholecystectomy.  Mild intra and extrahepatic biliary ductal prominence is nonspecific.  The CBD measures up to 8 mm.  Symmetric renal enhancement.  No hydronephrosis or hydroureter.  No bowel obstruction.  No CT evidence for colitis.  Appendix not identified.  Per report, post appendectomy.  No free intraperitoneal air or fluid.  No lymphadenopathy.  There is scattered atherosclerotic calcification of the aorta and its branches. No aneurysmal dilatation.  Thin-walled bladder.  Absent uterus.  No adnexal mass.  No acute osseous finding.  Mild multilevel facet arthropathy and right SI joint degenerative change.  IMPRESSION: The mild biliary duct prominence is nonspecific post cholecystectomy.  Correlate with LFTs and ERCP if warranted.   Original Report Authenticated By: Jearld Lesch, M.D.       Assessment/Plan Present on Admission:  .  Epigastric pain . Persistent vomiting . Dehydration   PLAN: The symptoms are likely the result of narcotic withdrawal;  Will treat her with intravenous Reglan, protonix. and Phenergan, but will withhold narcotics. Will get a urine drug screen. Will consider a GI consult if she is not much improved by morning  Code Status: FULL CODE   Disposition Plan: Pending on response to therapy    Andrea Nelson Nocturnist [facility] Pager [phone]   03/27/2012, 1:55 AM

## 2012-03-27 NOTE — Progress Notes (Signed)
Patient being discharge home. Patient given discharge instructions, no follow up appointment ordered, prescriptions given for nausea medication. Patient verbalized understanding of all discharge instructions. Patient alert, oriented and in stable condition at the time of discharge. Patient discharged home with mother.

## 2012-03-27 NOTE — ED Notes (Signed)
Patient reported falling and landing on arms and knees after attempting to get up from wheelchair in waiting room behind triage. Patient stated she did not want to bother staff by asking [place] to help her to restroom and that she needed to go quickly. Small abrasion with no bleeding noted to right posterior forearm and small area of slightly reddened skin noted to right knee. Patient denies any pain from fall, denies hitting head, and has no complaints at this time. Verbalized understanding of asking staff members for help when needing to ambulate.

## 2012-03-27 NOTE — ED Notes (Signed)
Pt requesting additional pain & nausea medication.

## 2012-03-27 NOTE — Progress Notes (Signed)
2340 Assumed care/disposition of patient. Patient with h/o pancreatitis, gastroparesis, lupus, here with persistent epigastric pain and vomiting. This is her third visit in less than 48 hours. Awaiting CT results. Labs with elevated wbc compared with labs from yesterday. She has received dilaudid x 2 with some relief , one dose of phenergan which helped. 1610 CT has returned and is unremarkable. Will arrange for admission. 12:50 AM:  T/C to Dr. Orvan Falconer, hospitalist case discussed, including:  HPI, pertinent PM/SHx, VS/PE, dx testing, ED course and treatment.  Agreeable to admission.  Requests to write temporary orders, med surg bed.  Ct Abdomen Pelvis W Contrast  03/27/2012  *RADIOLOGY REPORT*  Clinical Data: Epigastric abdominal pain  CT ABDOMEN AND PELVIS WITH CONTRAST  Technique:  Multidetector CT imaging of the abdomen and pelvis was performed following the standard protocol during bolus administration of intravenous contrast.  Contrast: OMNIPAQUE IOHEXOL 300 MG/ML  SOLN  Comparison: 03/19/2012 radiographs, 09/19/2011 CT  Findings: Limited images through the lung bases demonstrate no significant appreciable abnormality. The heart size is within normal limits. No pleural or pericardial effusion.  Unremarkable liver, spleen, pancreas, adrenal glands.  Status post cholecystectomy.  Mild intra and extrahepatic biliary ductal prominence is nonspecific.  The CBD measures up to 8 mm.  Symmetric renal enhancement.  No hydronephrosis or hydroureter.  No bowel obstruction.  No CT evidence for colitis.  Appendix not identified.  Per report, post appendectomy.  No free intraperitoneal air or fluid.  No lymphadenopathy.  There is scattered atherosclerotic calcification of the aorta and its branches. No aneurysmal dilatation.  Thin-walled bladder.  Absent uterus.  No adnexal mass.  No acute osseous finding.  Mild multilevel facet arthropathy and right SI joint degenerative change.  IMPRESSION: The mild biliary  duct prominence is nonspecific post cholecystectomy.  Correlate with LFTs and ERCP if warranted.   Original Report Authenticated By: Jearld Lesch, M.D.

## 2012-03-27 NOTE — Discharge Summary (Signed)
Physician Discharge Summary  Andrea Nelson QMV:[id number] DOB: 01-Mar-1966 DOA: 03/26/2012  PCP: Ignatius Specking., MD  Admit date: 03/26/2012 Discharge date: 03/27/2012  Time spent: Less than 30 minutes  Recommendations for Outpatient Follow-up:  1. Follow with primary care physician.   Discharge Diagnoses: 1. 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 2. Polysubstance drug abuse. Urine drug screen positive for barbiturates, benzodiazepines, opiates, cocaine and marijuana. 3. Vomiting and epigastric pain, secondary to #1 and 2. 4. SLE, stable.   Discharge Condition: Stable.  Diet recommendation: Regular as tolerated.  Filed Weights   03/26/12 2000 03/27/12 0300  Weight: 65.772 kg (145 lb) 68.5 kg (151 lb 0.2 oz)    History of present illness:  This 46 year old lady, who is well-known to our hospital, presented with symptoms of vomiting for the last week or so. Please see initial history as outlined below: HPI:  Andrea Nelson is an 46 y.o. female. Caucasian lady with a history of chronic nausea vomiting and abdominal pain of unclear etiology, who had an EGD done in May showed no evidence of significant problems, but did have positive gastric emptying study done, though she seems to her been on narcotics at the time. She has been placed on Protonix and Reglan.  Patient had multiple dental extractions October 28, and was placed on Dilaudid tablets for pain; patient says she has not had any pain medications since then, but review of [place] narcotic prescribing websites shows that she was subsequent given Percocet tablets by 2 different subscribers, and when they ran out patient says she's been having abdominal pain and persistent vomiting since then.  In the emergency room patient has been having abdominal pain and vomiting which she says is helped by Dilaudid Zofran and Phenergan, but because the vomiting has persisted in the emergency room the hospitalist service  was called to assist.  Patient is insisting that his pain which is triggering the vomiting, but she also feels she has something wrong with her stomach.  No history of fever cough or cold or other symptoms.  Reportedly has a history of SLE and is due to start a new treatment with Dr. Zenovia Jordan at the end of the month.  Hospital Course:  The patient was admitted overnight and workup was essentially unremarkable except for mildly elevated liver enzymes which have been present in the past and which were improving by the following day. CT scan of the abdomen was unremarkable and nonspecific. Urine drug screen was remarkable for presence of barbiturates, benzodiazepines, opioids, cocaine and marijuana. When she was seen on rounds, she looks clinically very stable. She stable to be discharged.  Procedures:  None.   Consultations:  None.  Discharge Exam: Filed Vitals:   03/27/12 0218 03/27/12 0300 03/27/12 0450 03/27/12 0621  BP: 136/67 148/84  137/77  Pulse: 60 58  65  Temp:  98.8 F (37.1 C) 99.2 F (37.3 C) 98.3 F (36.8 C)  TempSrc:  Oral Oral Oral  Resp: 16 18  17   Height:  5\' 8"  (1.727 m)    Weight:  68.5 kg (151 lb 0.2 oz)    SpO2: 98% 96%  99%    General: She looks systemically well. She does not look toxic or septic. Cardiovascular: Heart sounds are present and normal. Respiratory: Lung fields are clear. Abdomen is soft and nontender. She is alert and orientated.  Discharge Instructions  Discharge Orders    Future Orders Please Complete By Expires   Diet - low sodium  heart healthy      Increase activity slowly          Medication List     As of 03/27/2012  7:57 AM    TAKE these medications         albuterol 108 (90 BASE) MCG/ACT inhaler   Commonly known as: PROVENTIL HFA;VENTOLIN HFA   Inhale 2 puffs into the lungs every 6 (six) hours as needed. Shortness of Breath      metoCLOPramide 10 MG tablet   Commonly known as: REGLAN   Take 10 mg by mouth 4  (four) times daily.      pantoprazole 40 MG tablet   Commonly known as: PROTONIX   Take 1 tablet (40 mg total) by mouth daily.      promethazine 25 MG suppository   Commonly known as: PHENERGAN   Place 1 suppository (25 mg total) rectally every 6 (six) hours as needed for nausea.      promethazine 25 MG tablet   Commonly known as: PHENERGAN   Take 0.5 tablets (12.5 mg total) by mouth every 6 (six) hours as needed for nausea.          The results of significant diagnostics from this hospitalization (including imaging, microbiology, ancillary and laboratory) are listed below for reference.    Significant Diagnostic Studies: Dg Chest 2 View  02/29/2012  *RADIOLOGY REPORT*  Clinical Data: COPD.  Hypertension.  CHEST - 2 VIEW  Comparison: Plain films of the chest 09/07/2011.  Findings: The chest is hyperexpanded but the lungs are clear. Heart size is normal.  No pneumothorax or pleural fluid.  IMPRESSION: Pulmonary hyperexpansion compatible with emphysema.  No acute disease.   Original Report Authenticated By: Bernadene Bell. Maricela Curet, M.D.    Ct Abdomen Pelvis W Contrast  03/27/2012  *RADIOLOGY REPORT*  Clinical Data: Epigastric abdominal pain  CT ABDOMEN AND PELVIS WITH CONTRAST  Technique:  Multidetector CT imaging of the abdomen and pelvis was performed following the standard protocol during bolus administration of intravenous contrast.  Contrast: OMNIPAQUE IOHEXOL 300 MG/ML  SOLN  Comparison: 03/19/2012 radiographs, 09/19/2011 CT  Findings: Limited images through the lung bases demonstrate no significant appreciable abnormality. The heart size is within normal limits. No pleural or pericardial effusion.  Unremarkable liver, spleen, pancreas, adrenal glands.  Status post cholecystectomy.  Mild intra and extrahepatic biliary ductal prominence is nonspecific.  The CBD measures up to 8 mm.  Symmetric renal enhancement.  No hydronephrosis or hydroureter.  No bowel obstruction.  No CT evidence  for colitis.  Appendix not identified.  Per report, post appendectomy.  No free intraperitoneal air or fluid.  No lymphadenopathy.  There is scattered atherosclerotic calcification of the aorta and its branches. No aneurysmal dilatation.  Thin-walled bladder.  Absent uterus.  No adnexal mass.  No acute osseous finding.  Mild multilevel facet arthropathy and right SI joint degenerative change.  IMPRESSION: The mild biliary duct prominence is nonspecific post cholecystectomy.  Correlate with LFTs and ERCP if warranted.   Original Report Authenticated By: Jearld Lesch, M.D.    Dg Chest Port 1 View  03/17/2012  *RADIOLOGY REPORT*  Clinical Data: Chest pain.  PORTABLE CHEST - 1 VIEW  Comparison: 02/29/2012  Findings: Heart and mediastinal contours are within normal limits. No focal opacities or effusions.  No acute bony abnormality.  IMPRESSION: No active cardiopulmonary disease.   Original Report Authenticated By: Charlett Nose, M.D.    Dg Abd Acute W/chest  03/19/2012  *RADIOLOGY REPORT*  Clinical Data: Abdominal pain  ACUTE ABDOMEN SERIES (ABDOMEN 2 VIEW & CHEST 1 VIEW)  Comparison: 03/17/2012  Findings: Cardiomediastinal silhouette is stable.  No acute infiltrate or pleural effusion.  No pulmonary edema.  There is nonspecific nonobstructive bowel gas pattern.  Surgical clips are noted in the right upper abdomen.  Stool noted throughout the colon.  No free abdominal air.  IMPRESSION: No acute disease.  Nonspecific nonobstructive bowel gas pattern. No free abdominal air.  Stool noted throughout the colon.   Original Report Authenticated By: Natasha Mead, M.D.     Microbiology: Recent Results (from the past 240 hour(s))  URINE CULTURE     Status: Normal   Collection Time   03/19/12 11:03 AM      Component Value Range Status Comment   Specimen Description URINE, CLEAN CATCH   Final    Special Requests NONE   Final    Culture  Setup Time 03/19/2012 21:04   Final    Colony Count NO GROWTH   Final     Culture NO GROWTH   Final    Report Status 03/20/2012 FINAL   Final   MRSA PCR SCREENING     Status: Normal   Collection Time   03/27/12  2:43 AM      Component Value Range Status Comment   MRSA by PCR NEGATIVE  NEGATIVE Final      Labs: Basic Metabolic Panel:  Lab 03/27/12 1610 03/26/12 2050 03/25/12 1515  NA 138 141 139  K 3.5 3.8 3.7  CL 104 106 102  CO2 24 22 25   GLUCOSE 104* 90 112*  BUN 9 9 9   CREATININE 0.54 0.57 0.61  CALCIUM 9.1 9.9 10.1  MG -- -- --  PHOS -- -- --   Liver Function Tests:  Lab 03/26/12 2050 03/25/12 1515  AST 36 49*  ALT 81* 108*  ALKPHOS 75 77  BILITOT 0.6 0.6  PROT 7.9 8.2  ALBUMIN 4.4 4.5    Lab 03/26/12 2050 03/25/12 1515  LIPASE 26 19  AMYLASE -- --    CBC:  Lab 03/27/12 0355 03/26/12 2050 03/25/12 1515  WBC 6.6 14.0* 9.0  NEUTROABS -- 11.2* 6.7  HGB 14.4 15.1* 15.2*  HCT 42.7 44.7 44.5  MCV 90.9 92.7 91.4  PLT 221 276 319          Signed:  GOSRANI,NIMISH C  [facility] 03/27/2012, 7:57 AM

## 2012-06-08 NOTE — Progress Notes (Signed)
UR Chart Review Completed  

## 2012-12-01 DIAGNOSIS — R079 Chest pain, unspecified: Secondary | ICD-10-CM

## 2012-12-02 DIAGNOSIS — R079 Chest pain, unspecified: Secondary | ICD-10-CM

## 2013-01-02 DIAGNOSIS — R079 Chest pain, unspecified: Secondary | ICD-10-CM

## 2013-10-01 IMAGING — CT CT ABD-PELV W/O CM
2 of 4 series · 16 of 46 positions shown, 18 images · non-contrast
Comparison: 05/16/2011

CLINICAL DATA: Upper abdominal pain, history of uterine cancer,
prior appendectomy, prior hysterectomy, prior cholecystectomy

CT ABDOMEN AND PELVIS WITHOUT CONTRAST
TECHNIQUE: Multidetector CT imaging of the abdomen and pelvis was
performed following the standard protocol without intravenous
contrast.

[Series 2: abdomen/pelvis w/o contrast · axial · non-contrast · 0.66mm/px · z∈[-564,-169]mm · 13 of 93 slices shown, 15 images]
[im 7/93  soft-tissue]
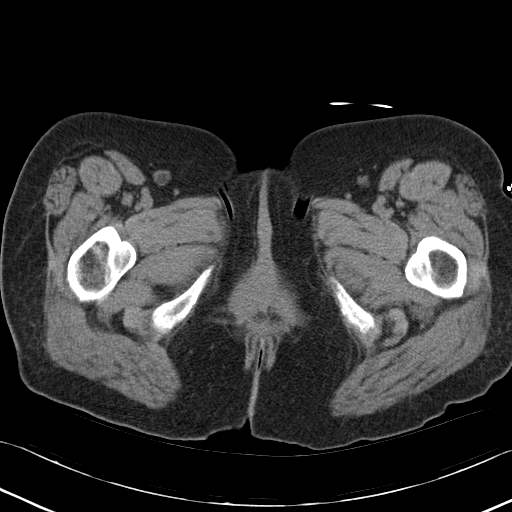
[im 7/93  bone]
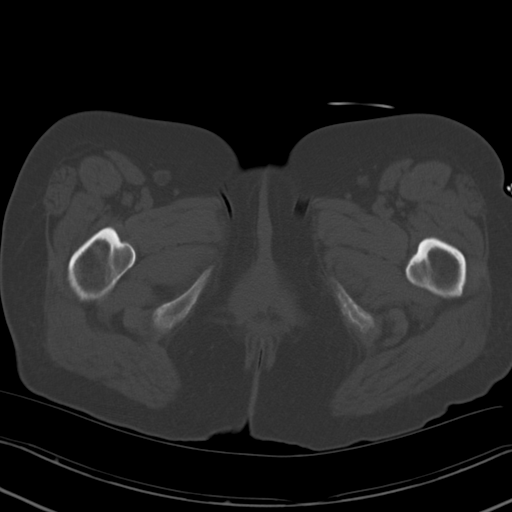
[im 13/93  soft-tissue]
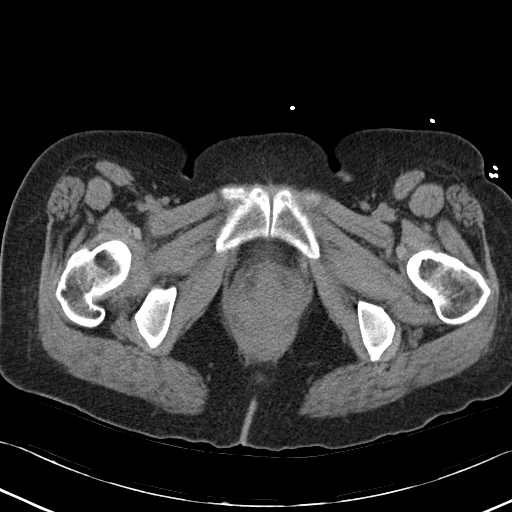
[im 19/93  soft-tissue]
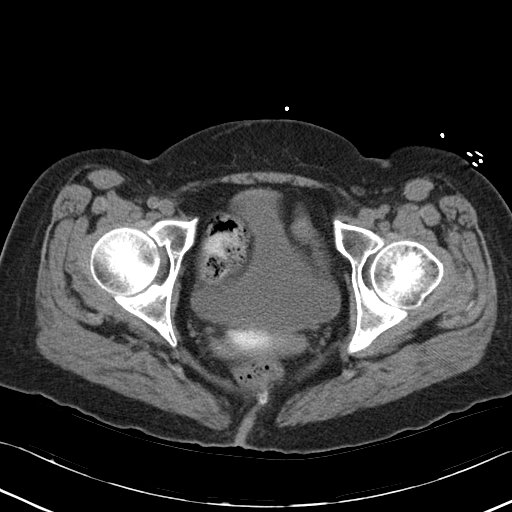
[im 25/93  soft-tissue]
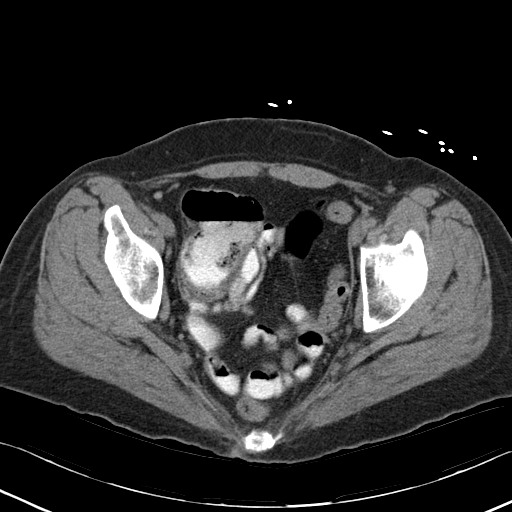
[im 31/93  soft-tissue]
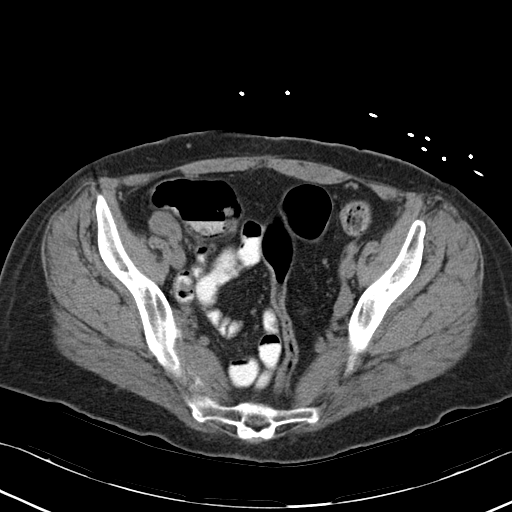
[im 37/93  soft-tissue]
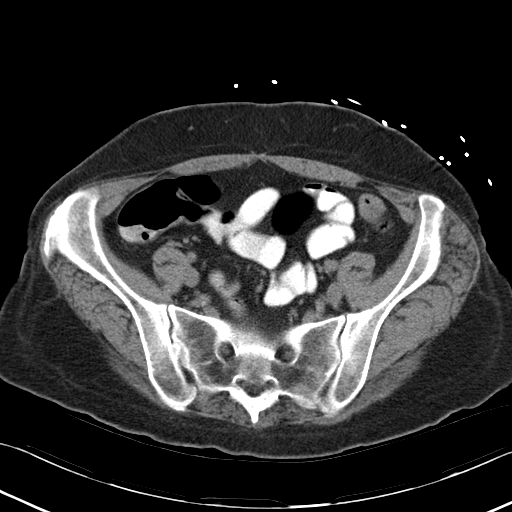
[im 50/93  soft-tissue]
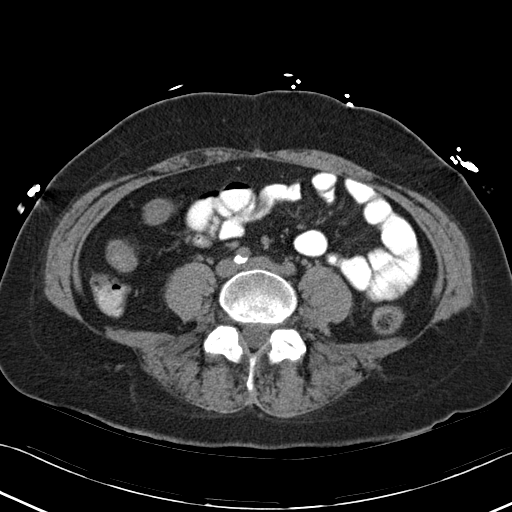
[im 56/93  soft-tissue]
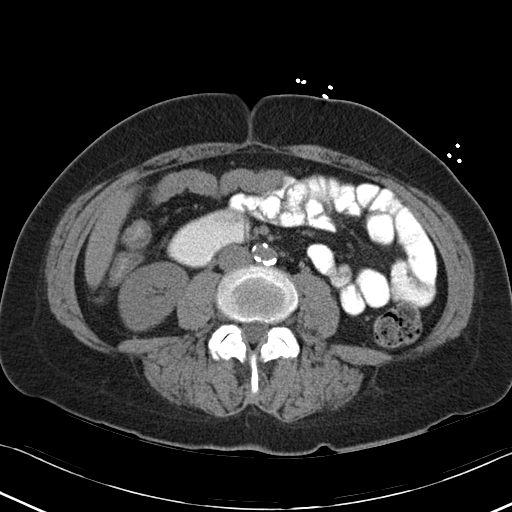
[im 62/93  soft-tissue]
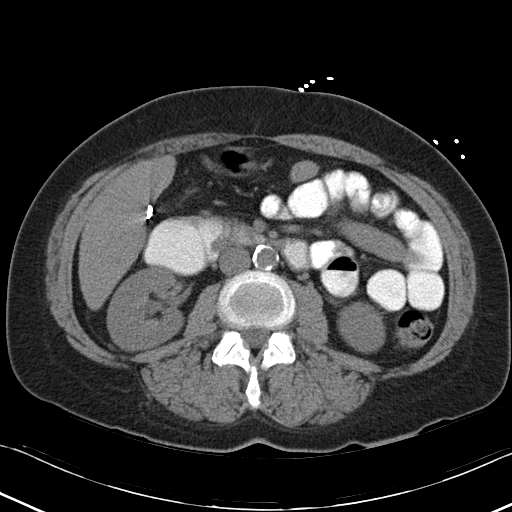
[im 62/93  bone]
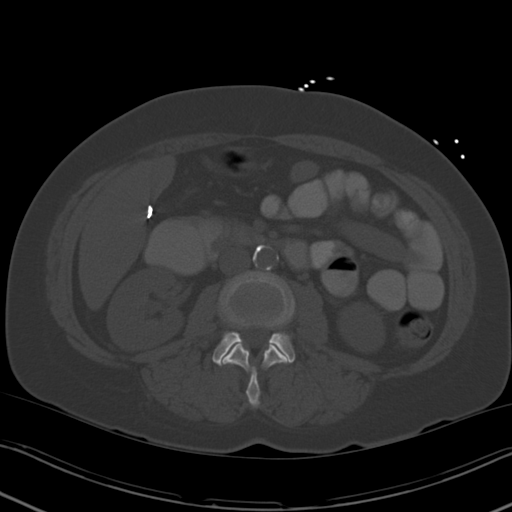
[im 68/93  soft-tissue]
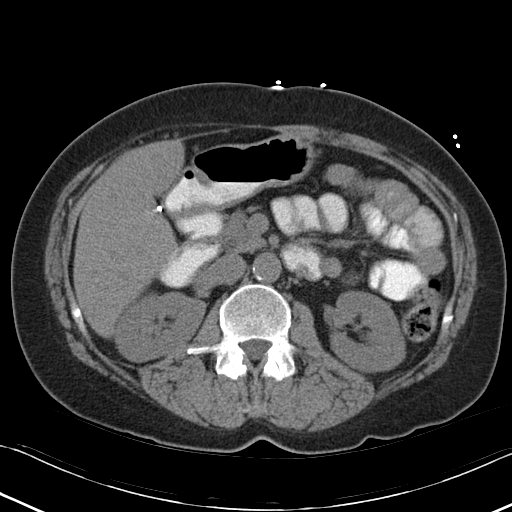
[im 74/93  soft-tissue]
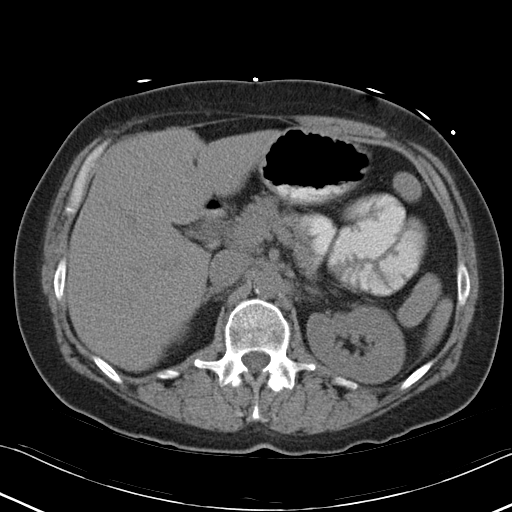
[im 80/93  soft-tissue]
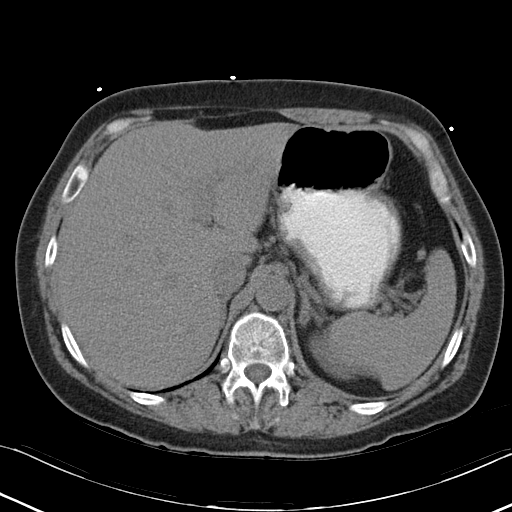
[im 86/93  soft-tissue]
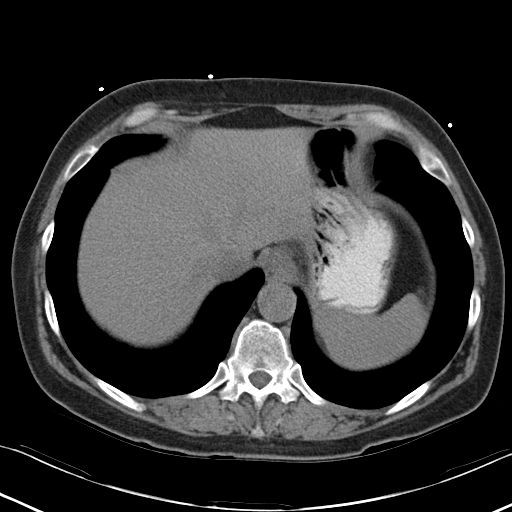

[Series 5: mpr cor (id) · coronal · 0.91mm/px · 3 of 74 slices shown]
[im 25/74  soft-tissue]
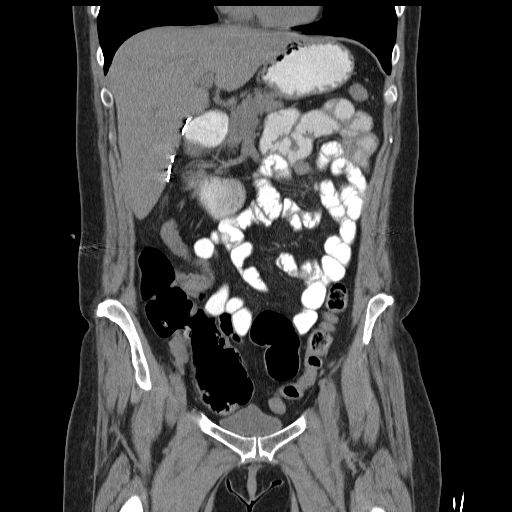
[im 33/74  soft-tissue]
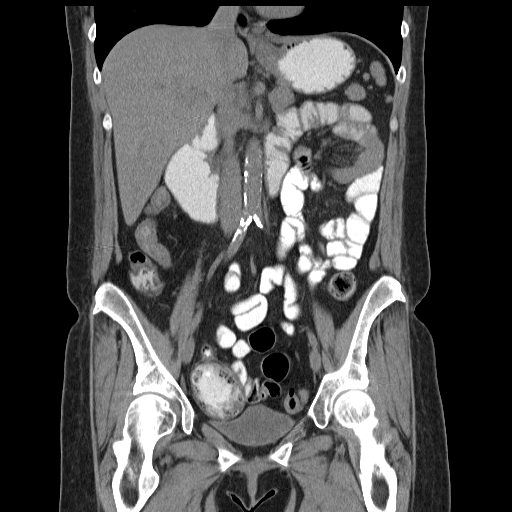
[im 41/74  soft-tissue]
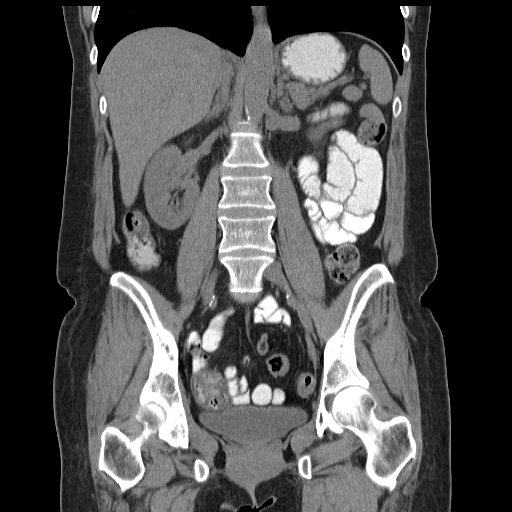

[16 of 46 positions shown; findings below may reference images not displayed]

FINDINGS: Lung bases are unremarkable.  Small amount of contrast is
noted in distal esophagus.  Nonspecific mild thickening of distal
esophageal wall.  This may be due to gastroesophageal reflux
disease.  Clinical correlation is necessary.

Heart size is within normal limits.  Study is limited without IV
contrast.  Unenhanced liver shows no biliary ductal dilatation.
The patient is status post cholecystectomy.  Unenhanced spleen,
pancreas and adrenal glands are unremarkable.  Small accessory
splenule is noted.

Unenhanced kidneys are symmetrical in size.  There is no
nephrolithiasis.  No hydronephrosis or hydroureter.

Mild atherosclerotic calcifications distal abdominal aorta and the
iliac arteries.  No aortic aneurysm.

Oral contrast material was given to the patient.  There is no small
bowel or colonic obstruction.  No ascites or free air.  No
adenopathy.

No pericecal inflammation.  Limited assessment of the transverse
and proximal left colon which is empty collapsed non opacified with
contrast.  Some stool noted in the left colon.

The appendix is surgically absent.  There is a low-lying cecum with
tip in the right anterior pelvis just above the urinary bladder.

The uterus is surgically absent. Bowel visualized unenhanced ovary
is unremarkable.  Small amount of pelvic free fluid noted in the
posterior pelvis.  No distal colonic obstruction.  No destructive
bony lesions are noted within pelvis. The urinary bladder is
unremarkable.

Sagittal images of the spine are unremarkable.
IMPRESSION: 1.  No acute inflammatory process within abdomen or pelvis.  There
is a low-lying cecum.  No pericecal inflammation.  Status post
appendectomy.
2.  Status post cholecystectomy.

4.  No small bowel obstruction.  No ascites or free air.
5.  No hydronephrosis or hydroureter.
6.  Surgically absent uterus.

## 2015-04-14 ENCOUNTER — Encounter (HOSPITAL_COMMUNITY): Payer: Self-pay | Admitting: Emergency Medicine

## 2015-04-14 ENCOUNTER — Emergency Department (HOSPITAL_COMMUNITY)
Admission: EM | Admit: 2015-04-14 | Discharge: 2015-04-14 | Disposition: A | Discharge status: Home / Self Care | Payer: Medicaid Other | Attending: Emergency Medicine | Admitting: Emergency Medicine

## 2015-04-14 ENCOUNTER — Emergency Department (HOSPITAL_COMMUNITY): Payer: Medicaid Other

## 2015-04-14 DIAGNOSIS — M545 Low back pain, unspecified: Secondary | ICD-10-CM

## 2015-04-14 DIAGNOSIS — G8929 Other chronic pain: Secondary | ICD-10-CM | POA: Insufficient documentation

## 2015-04-14 DIAGNOSIS — Z86010 Personal history of colonic polyps: Secondary | ICD-10-CM | POA: Insufficient documentation

## 2015-04-14 DIAGNOSIS — Z79899 Other long term (current) drug therapy: Secondary | ICD-10-CM | POA: Insufficient documentation

## 2015-04-14 DIAGNOSIS — Z8719 Personal history of other diseases of the digestive system: Secondary | ICD-10-CM | POA: Insufficient documentation

## 2015-04-14 DIAGNOSIS — R112 Nausea with vomiting, unspecified: Secondary | ICD-10-CM | POA: Insufficient documentation

## 2015-04-14 DIAGNOSIS — Z8541 Personal history of malignant neoplasm of cervix uteri: Secondary | ICD-10-CM | POA: Insufficient documentation

## 2015-04-14 DIAGNOSIS — Z8711 Personal history of peptic ulcer disease: Secondary | ICD-10-CM | POA: Insufficient documentation

## 2015-04-14 HISTORY — DX: Nausea with vomiting, unspecified: R11.2

## 2015-04-14 HISTORY — DX: Dorsalgia, unspecified: M54.9

## 2015-04-14 HISTORY — DX: Other chronic pain: G89.29

## 2015-04-14 LAB — URINALYSIS, ROUTINE W REFLEX MICROSCOPIC
GLUCOSE, UA: NEGATIVE mg/dL
HGB URINE DIPSTICK: NEGATIVE
KETONES UR: 15 mg/dL — AB
LEUKOCYTES UA: NEGATIVE
Nitrite: NEGATIVE
PH: 6 (ref 5.0–8.0)
PROTEIN: 30 mg/dL — AB
Specific Gravity, Urine: 1.02 (ref 1.005–1.030)

## 2015-04-14 LAB — CBC WITH DIFFERENTIAL/PLATELET
BASOS ABS: 0 10*3/uL (ref 0.0–0.1)
BASOS PCT: 0 %
Eosinophils Absolute: 0 10*3/uL (ref 0.0–0.7)
Eosinophils Relative: 0 %
HEMATOCRIT: 46.5 % — AB (ref 36.0–46.0)
HEMOGLOBIN: 15.9 g/dL — AB (ref 12.0–15.0)
Lymphocytes Relative: 10 %
Lymphs Abs: 1 10*3/uL (ref 0.7–4.0)
MCH: 31.7 pg (ref 26.0–34.0)
MCHC: 34.2 g/dL (ref 30.0–36.0)
MCV: 92.8 fL (ref 78.0–100.0)
MONO ABS: 0.3 10*3/uL (ref 0.1–1.0)
Monocytes Relative: 2 %
NEUTROS PCT: 88 %
Neutro Abs: 9 10*3/uL — ABNORMAL HIGH (ref 1.7–7.7)
Platelets: 336 10*3/uL (ref 150–400)
RBC: 5.01 MIL/uL (ref 3.87–5.11)
RDW: 12.6 % (ref 11.5–15.5)
WBC: 10.2 10*3/uL (ref 4.0–10.5)

## 2015-04-14 LAB — URINE MICROSCOPIC-ADD ON: RBC / HPF: NONE SEEN RBC/hpf (ref 0–5)

## 2015-04-14 LAB — LIPASE, BLOOD: Lipase: 15 U/L (ref 11–51)

## 2015-04-14 LAB — COMPREHENSIVE METABOLIC PANEL
ALK PHOS: 100 U/L (ref 38–126)
ALT: 13 U/L — ABNORMAL LOW (ref 14–54)
ANION GAP: 10 (ref 5–15)
AST: 15 U/L (ref 15–41)
Albumin: 4.4 g/dL (ref 3.5–5.0)
BILIRUBIN TOTAL: 0.6 mg/dL (ref 0.3–1.2)
BUN: 12 mg/dL (ref 6–20)
CALCIUM: 9.8 mg/dL (ref 8.9–10.3)
CO2: 29 mmol/L (ref 22–32)
Chloride: 104 mmol/L (ref 101–111)
Creatinine, Ser: 0.67 mg/dL (ref 0.44–1.00)
Glucose, Bld: 128 mg/dL — ABNORMAL HIGH (ref 65–99)
POTASSIUM: 3.3 mmol/L — AB (ref 3.5–5.1)
Sodium: 143 mmol/L (ref 135–145)
TOTAL PROTEIN: 8.8 g/dL — AB (ref 6.5–8.1)

## 2015-04-14 MED ORDER — ONDANSETRON 4 MG PO TBDP: 4.0000 mg | ORAL_TABLET | Freq: Three times a day (TID) | ORAL | Status: DC | PRN

## 2015-04-14 MED ORDER — PROMETHAZINE HCL 25 MG/ML IJ SOLN
25.0000 mg | Freq: Once | INTRAMUSCULAR | Status: AC
  Administered 2015-04-14: 25 mg via INTRAMUSCULAR
  Filled 2015-04-14: qty 1

## 2015-04-14 MED ORDER — PROMETHAZINE HCL 25 MG RE SUPP: 25.0000 mg | Freq: Four times a day (QID) | RECTAL | Status: AC | PRN

## 2015-04-14 MED ORDER — FENTANYL CITRATE (PF) 100 MCG/2ML IJ SOLN
50.0000 ug | Freq: Once | INTRAMUSCULAR | Status: AC
  Administered 2015-04-14: 50 ug via INTRAMUSCULAR
  Filled 2015-04-14: qty 2

## 2015-04-14 MED ORDER — POTASSIUM CHLORIDE CRYS ER 20 MEQ PO TBCR: 40.0000 meq | EXTENDED_RELEASE_TABLET | Freq: Once | ORAL | Status: DC

## 2015-04-14 MED ORDER — HYDROCODONE-ACETAMINOPHEN 5-325 MG PO TABS: ORAL_TABLET | ORAL | Status: AC

## 2015-04-14 MED ORDER — ONDANSETRON 4 MG PO TBDP: 4.0000 mg | ORAL_TABLET | Freq: Once | ORAL | Status: DC

## 2015-04-14 MED ORDER — ONDANSETRON 8 MG PO TBDP
8.0000 mg | ORAL_TABLET | Freq: Once | ORAL | Status: AC
  Administered 2015-04-14: 8 mg via ORAL
  Filled 2015-04-14: qty 1

## 2015-04-14 MED ORDER — METHOCARBAMOL 500 MG PO TABS: 1000.0000 mg | ORAL_TABLET | Freq: Four times a day (QID) | ORAL | Status: AC | PRN

## 2015-04-14 MED ORDER — FENTANYL CITRATE (PF) 100 MCG/2ML IJ SOLN
50.0000 ug | Freq: Once | INTRAMUSCULAR | Status: AC
Start: 2015-04-14 — End: 2015-04-14
  Administered 2015-04-14: 50 ug via INTRAMUSCULAR
  Filled 2015-04-14: qty 2

## 2015-04-14 NOTE — ED Notes (Signed)
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

## 2015-04-14 NOTE — Discharge Instructions (Signed)
°Emergency Department Resource Guide °1) Find a Doctor and Pay Out of Pocket °Although you won't have to find out who is covered by your insurance plan, it is a good idea to ask around and get recommendations. You will then need to call the office and see if the doctor you have chosen will accept you as a new patient and what types of options they offer for patients who are self-pay. Some doctors offer discounts or will set up payment plans for their patients who do not have insurance, but you will need to ask so you aren't surprised when you get to your appointment. ° °2) Contact Your Local Health Department °Not all health departments have doctors that can see patients for sick visits, but many do, so it is worth a call to see if yours does. If you don't know where your local health department is, you can check in your phone book. The CDC also has a tool to help you locate your state's health department, and many state websites also have listings of all of their local health departments. ° °3) Find a Walk-in Clinic °If your illness is not likely to be very severe or complicated, you may want to try a walk in clinic. These are popping up all over the country in pharmacies, drugstores, and shopping centers. They're usually staffed by nurse practitioners or physician assistants that have been trained to treat common illnesses and complaints. They're usually fairly quick and inexpensive. However, if you have serious medical issues or chronic medical problems, these are probably not your best option. ° °No Primary Care Doctor: °- Call [facility] at  832-8000 - they can help you locate a primary care doctor that  accepts your insurance, provides certain services, etc. °- Physician Referral Service- 1-800-533-3463 ° °Chronic Pain Problems: °Organization         Address  Phone   Notes  °[facility]  (336) 297-2271 Patients need to be referred by their primary care doctor.  ° °Medication  Assistance: °Organization         Address  Phone   Notes  °[place] [facility] [address] °[place], [place] [address] (336) 641-8030 --Must be a resident of [place] °-- Must have NO insurance coverage whatsoever (no Medicaid/ Medicare, etc.) °-- The pt. MUST have a primary care doctor that directs their care regularly and follows them in the community °  °[facility]  (866) 331-1348   °[employer]  (888) 892-1162   ° °Agencies that provide inexpensive medical care: °Organization         Address  Phone   Notes  °[facility]  (336) 832-8035   °[facility]    (336) 832-7272   °[facility] [address] °[place], [place] [address] (336) 832-4777   °[facility] [address], °[place] (336) 271-4999   °[facility]    (336) 373-0678   °[facility]    (336) 272-1050   °[facility] ° [address], [place] Phone:  (336) 832-4444, Fax:  (336) 832-4440 Hours of Operation:  9 am - 6 pm, M-F.  Also accepts Medicaid/Medicare and self-pay.  °[facility] ° [address], [place] Phone: (336) 832-3150, Fax: (336) [phone]. Hours of Operation:  8:30 am - 5:30 pm, M-F.  Also accepts Medicaid and self-pay.  °[facility] [place] [address], [place] Phone: (336) 878-6027   °[facility] [address], [place], [place] (336)723-1848, Ext. 123 Mondays & Thursdays: 7-9 AM.  First 15 patients are seen on a first come, first serve basis. °  ° °Medicaid-accepting [place] Providers: ° °Organization         Address  Phone   Notes  °[facility] [address], [place] (336) 641-2100 Also accepts self-pay patients.  °[facility] [address], [place] ° (336) 856-9996   °[facility] [address], [place]  (336) 288-8857   °[facility] [address] [place] [address], [place] (336) 299-7000   °Veita Bland [address], [place]  ° (336) 373-1557 Only accepts [place] Access Medicaid patients after they have their name applied to their card.  ° °Self-Pay (no insurance) in [place]: ° °Organization         Address  Phone   Notes  °Sickle Cell Patients, [facility] [address], [place] (336) 832-1970   °[facility] [address], [place] (336) 832-4400   °[facility] ° [address], [place] (336) 992-4800   °[facility]/Dr. Osei-Bonsu ° [address] [place] [address], [place] or [address], [place] (336) [phone] Phone number for both [place] and [place] locations is the same.  °[facility] [address], [place] (336) 299-0000   °[facility] [address] [place] [address], [place] or [address] (336) 852-7530 °(336) 878-2260   °[facility] [address], [place] (336) 350-1642, phone; (336) 294-5005, fax Sees patients 1st and 3rd Saturday of every month.  Must not qualify for public or private insurance (i.e. Medicaid, Medicare, [place] Health Choice, Veterans' Benefits) • Household income should be no more than 200% of the poverty level •The clinic cannot treat you if you are pregnant or think you are pregnant • Sexually transmitted diseases are not treated at the clinic.  ° ° °Dental Care: °Organization         Address  Phone  Notes  °[place] [facility] [address], [place] (336) 641-6152 Accepts children up to age 21 who are enrolled in Medicaid or [place] Health Choice; pregnant women with a Medicaid card; and children who have applied for Medicaid or [place] Health Choice, but were declined, whose parents can pay a reduced fee at time of service.  °[place]  [facility] [place]  [address], [place] (336) 641-7733 Accepts children up to age 21 who are enrolled in Medicaid or [place] Health Choice; pregnant women with a Medicaid card; and children who have applied for Medicaid or [place] Health Choice, but were declined, whose parents can pay a reduced fee at time of service.  °[facility] ° [address], [place] (336) 641-4533 Patients are seen by appointment only. Walk-ins are not accepted. [facility] will see patients 18 years of age and older. °Monday - Tuesday (8am-5pm) °Most Wednesdays (8:30-5pm) °$30 per visit, cash only  °[facility] ° [address], [place] (336) 641-4533 Patients are seen by appointment only. Walk-ins are not accepted. [facility] will see patients 18 years of age and older. °One   Wednesday Evening (Monthly: Volunteer Based).  $30 per visit, cash only  °[facility]  (919) 537-3737 for adults; Children under age 4, call Graduate Pediatric Dentistry at ([phone]) [phone]. Children aged 4-14, please call ([phone]) [phone] to request a pediatric application. ° Dental services are provided in all areas of dental care including fillings, crowns and bridges, complete and partial dentures, implants, gum treatment, root canals, and extractions. Preventive care is also provided. Treatment is provided to both adults and children. °Patients are selected via a lottery and there is often a waiting list. °  °[facility] [address], °[place] ° (336) 763-8833 [email or web address] °  °[facility] [address], [place], [place] (336)723-1848, Ext. 123 Second and Fourth Thursday of each month, opens at 6:30 AM; Clinic ends at 9 AM.  Patients are seen on a first-come first-served basis, and a limited number are seen during each clinic.  ° °[facility] ° [address], [place], [place] (336) [phone]    Eligibility Requirements °You must have lived in [place], [place], or [place] counties for at least the last three months. °  You cannot be eligible for state or federal sponsored healthcare insurance, including Veterans Administration, Medicaid, or Medicare. °  You generally cannot be eligible for healthcare insurance through your employer.  °  How to apply: °Eligibility screenings are held every Tuesday and Wednesday afternoon from 1:00 pm until 4:00 pm. You do not need an appointment for the interview!  °[facility] [address], [place], [place] 336-631-2330   °[facility]  336-342-8273   °[facility]  336-703-3100   °[facility]  336-570-6415   ° °Behavioral Health Resources in the Community: °Intensive Outpatient Programs °Organization         Address  Phone  Notes  °[facility] [address], [place], [place] 336-878-6098   °[facility] [address], [place], [place] 336-832-9800   °[facility] [address], [place], [place] ° 336-882-2125   °[facility] [address],  °[place], [place] 1-800-853-5163 or 336-641-4981   °Substance Abuse Resources °Organization         Address  Phone  Notes  °[facility]  336-882-2125   °[facility]  336-784-9470   °[facility]  336-285-9073   °[facility]  336-845-3988   °Residential & Outpatient Substance Abuse Program  1-800-659-3381   °Psychological Services °Organization         Address  Phone  Notes  °[facility]  336- 832-9600   °[facility]  336- 378-7881   °[facility] [address], [place] 1-800-853-5163 or 336-641-4981   ° °Mobile Crisis Teams °Organization         Address  Phone  Notes  °[facility], Mobile Crisis Care Unit  1-877-626-1772   °Assertive °[facility] ° [address]  [place], [place] 336-834-9664   °Sharon DeEsch [address] °[place] 336-554-5454   ° °Self-Help/Support Groups °Organization         Address  Phone             Notes  °[facility] [place] - variety of support groups  336- 373-1402 Call for more information  °Narcotics Anonymous (NA), [facility] [address], °[place]  2 meetings at this location  ° °  Residential Treatment Programs Organization         Address  Phone  Notes  [facility] [address]    [place]  1-959-488-2114   [facility]  [address] [place] [id number], [place], [place]   [place], [place] (902)074-6464 Admissions: 8am-3pm M-F  [facility] [place] [address]    [place], [place] [id number]   [facility] [address] [place], [place], [place]   [facility] [address]  [place], [place]   [facility] Intensive Outpatient [address], [place], [place]   [facility] ([place] [address]  [place], [place] 1-984 593 2434 or 425-820-2220   [facility] [address], [place], [place] Accepts Medicaid  [facility] [address]  [place] 1-832-581-9291 Substance Abuse/Addiction Treatment   [facility]         Address  Phone  Notes  [facility]  434-217-8622   Domenic Schwab, PhD [address] [place], [place]   (321)124-6467 or (682) 164-0296   [facility] [place], [place] (401)042-8587   [facility] [address] [place], [place] (463) 225-1515 Insurance/Medicaid/sponsorship through [facility] [address]                                    [place], [place] [phone] [place] [address], [place] 780-807-8532    Dr. Adele Schilder  (408)431-1006   [facility] Dept. 1) [address], [place] 2) [place] 3)  [place] [address], [place] [phone]  7068826389   [place] (734) 002-3533 or 256-125-8637 (After Hours)      Take the prescriptions as directed.  Increase your fluid intake (ie:  Gatoraide) for the next few days.  Eat a bland diet and advance to your regular diet slowly as you can tolerate it.  Apply moist heat or ice to the area(s) of discomfort, for 15 minutes at a time, several times per day for the next few days.  Do not fall asleep on a heating or ice pack.  Call your regular medical doctor tomorrow to schedule a follow up appointment in the next 2 days.  Return to the Emergency Department immediately if worsening.

## 2015-04-14 NOTE — ED Provider Notes (Signed)
CSN: [id number]     Arrival date & time 04/14/15  1557 History   First MD Initiated Contact with Patient 04/14/15 1626     Chief Complaint  Patient presents with  . Back Pain  . Hip Pain  . Emesis      HPI Pt was seen at 1630. Per pt, c/o gradual onset and persistence of constant acute flair of her chronic low back "pain" for the past 2 weeks. Pt states she "had one of my seizures" 2 weeks ago and "hurt it." States she is concerned "about breaking a bone in my back because that's what happened one other time I had a seizure."  Pain worsens with palpation of the area and body position changes. States the "pain has caused" her to have N/V today. Denies incont/retention of bowel or bladder, no saddle anesthesia, no focal motor weakness, no tingling/numbness in extremities, no fevers, no direct injury, no abd pain, no diarrhea, no black or blood in stools or emesis, no CP/SOB.     Past Medical History  Diagnosis Date  . Colitis 11/2010    ?  [name] Lupus ([place])   . DDD (degenerative disc disease)   . Pancreatitis 2012    [name]  . Uterine cancer ([place]) 1996  . Adenomatous polyp of colon 02/2010  . PUD (peptic ulcer disease)   . COPD (chronic obstructive pulmonary disease) ([place])   . Chronic abdominal pain   . Polysubstance abuse     see admission 03/27/12  . Nausea and vomiting     chronic, recurrent  . Chronic back pain    Past Surgical History  Procedure Laterality Date  . Abdominal hysterectomy  1996    w/ right salpingoopherectomy  . Appendectomy  1977  . Cholecystectomy  1987  . Cesarean section  1987  . Abdominal exploration surgery  1996    fibroid  . Colonoscopy w/ biopsies  02/2010    Dr Christoper Fabian, Mikel Cella  . Esophagogastroduodenoscopy  02/2010    Dr Donnamae Jude  . Shoulder surgery    . Esophagogastroduodenoscopy  09/21/11    Rourk-mild erosive esophagitis, small hiatal hernia, esophageal nodules  . Esophagogastroduodenoscopy  09/20/2011    Procedure:  ESOPHAGOGASTRODUODENOSCOPY (EGD);  Surgeon: Daneil Dolin, MD;  [facility];  Service: Endoscopy;  Laterality: N/A;  . Multiple extractions with alveoloplasty  03/06/2012    Procedure: MULTIPLE EXTRACION WITH ALVEOLOPLASTY;  Surgeon: Gae Bon, DDS;  Location: [facility];  Service: Oral Surgery;  Laterality: Bilateral;  PALATAL EXCISE, PALATAL LESIONS WITH BIOPSY   Family History  Problem Relation Age of Onset  . Ulcers Mother   . Colon cancer Maternal Grandmother   . CAD Mother   . Cancer - Lung Father 71  . Hypertension Mother    Social History  Substance Use Topics  . Smoking status: Current Every Day Smoker -- 0.50 packs/day for 25 years    Types: Cigarettes  . Smokeless tobacco: None  . Alcohol Use: No    Review of Systems ROS: Statement: All systems negative except as marked or noted in the HPI; Constitutional: Negative for fever and chills. ; ; Eyes: Negative for eye pain, redness and discharge. ; ; ENMT: Negative for ear pain, hoarseness, nasal congestion, sinus pressure and sore throat. ; ; Cardiovascular: Negative for chest pain, palpitations, diaphoresis, dyspnea and peripheral edema. ; ; Respiratory: Negative for cough, wheezing and stridor. ; ; Gastrointestinal: +N/V. Negative for diarrhea, abdominal pain, blood in stool, hematemesis, jaundice and rectal  bleeding. . ; ; Genitourinary: Negative for dysuria, flank pain and hematuria. ; ; Musculoskeletal: +LBP. Negative for neck pain. Negative for swelling and deformity..; ; Skin: Negative for pruritus, rash, abrasions, blisters, bruising and skin lesion.; ; Neuro: Negative for headache, lightheadedness and neck stiffness. Negative for weakness, altered level of consciousness , altered mental status, extremity weakness, paresthesias, involuntary movement, seizure and syncope.      Allergies  Honey bee venom; Ibuprofen; Ketorolac tromethamine; and Penicillins  Home Medications   Prior to Admission medications    Medication Sig Start Date End Date Taking? Authorizing Provider  albuterol (PROVENTIL HFA;VENTOLIN HFA) 108 (90 BASE) MCG/ACT inhaler Inhale 2 puffs into the lungs every 6 (six) hours as needed. Shortness of Breath    Historical Provider, MD  metoCLOPramide (REGLAN) 10 MG tablet Take 10 mg by mouth 4 (four) times daily. [id number]   Delora Fuel, MD  pantoprazole (PROTONIX) 40 MG tablet Take 1 tablet (40 mg total) by mouth daily. [id number]   Delora Fuel, MD  promethazine (PHENERGAN) 25 MG suppository Place 1 suppository (25 mg total) rectally every 6 (six) hours as needed for nausea. 03/26/12   Prentiss Bells, MD   BP 142/73 mmHg  Pulse 66  Temp(Src) 97.5 F (36.4 C) (Oral)  Resp 18  Ht 5\' 7"  (1.702 m)  Wt 135 lb (61.236 kg)  BMI 21.14 kg/m2  SpO2 93% Physical Exam  1635: Physical examination:  Nursing notes reviewed; Vital signs and O2 SAT reviewed;  Constitutional: Well developed, Well nourished, Well hydrated, In no acute distress; Head:  Normocephalic, atraumatic; Eyes: EOMI, PERRL, No scleral icterus; ENMT: Mouth and pharynx normal, Mucous membranes moist; Neck: Supple, Full range of motion, No lymphadenopathy; Cardiovascular: Regular rate and rhythm, No gallop; Respiratory: Breath sounds clear & equal bilaterally, No wheezes.  Speaking full sentences with ease, Normal respiratory effort/excursion; Chest: Nontender, Movement normal; Abdomen: Soft, Nontender, Nondistended, Normal bowel sounds; Genitourinary: No CVA tenderness; Spine:  No midline CS, TS, LS tenderness. +TTP left lumbar paraspinal muscles. No rash.;; Extremities: Pulses normal, Pelvis stable. NT left hip. No deformity. No edema, No calf edema or asymmetry.; Neuro: AA&Ox3, Major CN grossly intact.  Speech clear. No gross focal motor or sensory deficits in extremities.; Skin: Color normal, Warm, Dry.   ED Course  Procedures (including critical care time) Labs Review   Imaging Review  I have personally reviewed and evaluated  these images and lab results as part of my medical decision-making.   EKG Interpretation None      MDM  MDM Reviewed: previous chart, nursing note and vitals Reviewed previous: labs Interpretation: labs and CT scan     Results for orders placed or performed during the hospital encounter of 04/14/15  Urinalysis, Routine w reflex microscopic  Result Value Ref Range   Color, Urine YELLOW YELLOW   APPearance CLEAR CLEAR   Specific Gravity, Urine 1.020 1.005 - 1.030   pH 6.0 5.0 - 8.0   Glucose, UA NEGATIVE NEGATIVE mg/dL   Hgb urine dipstick NEGATIVE NEGATIVE   Bilirubin Urine MODERATE (A) NEGATIVE   Ketones, ur 15 (A) NEGATIVE mg/dL   Protein, ur 30 (A) NEGATIVE mg/dL   Nitrite NEGATIVE NEGATIVE   Leukocytes, UA NEGATIVE NEGATIVE  Urine microscopic-add on  Result Value Ref Range   Squamous Epithelial / LPF 0-5 (A) NONE SEEN   WBC, UA 0-5 0 - 5 WBC/hpf   RBC / HPF NONE SEEN 0 - 5 RBC/hpf   Bacteria, UA RARE (A) NONE SEEN  Urine-Other MUCOUS PRESENT   Lipase, blood  Result Value Ref Range   Lipase 15 11 - 51 U/L  Comprehensive metabolic panel  Result Value Ref Range   Sodium 143 135 - 145 mmol/L   Potassium 3.3 (L) 3.5 - 5.1 mmol/L   Chloride 104 101 - 111 mmol/L   CO2 29 22 - 32 mmol/L   Glucose, Bld 128 (H) 65 - 99 mg/dL   BUN 12 6 - 20 mg/dL   Creatinine, Ser 0.67 0.44 - 1.00 mg/dL   Calcium 9.8 8.9 - 10.3 mg/dL   Total Protein 8.8 (H) 6.5 - 8.1 g/dL   Albumin 4.4 3.5 - 5.0 g/dL   AST 15 15 - 41 U/L   ALT 13 (L) 14 - 54 U/L   Alkaline Phosphatase 100 38 - 126 U/L   Total Bilirubin 0.6 0.3 - 1.2 mg/dL   GFR calc non Af Amer >60 >60 mL/min   GFR calc Af Amer >60 >60 mL/min   Anion gap 10 5 - 15  CBC with Differential  Result Value Ref Range   WBC 10.2 4.0 - 10.5 K/uL   RBC 5.01 3.87 - 5.11 MIL/uL   Hemoglobin 15.9 (H) 12.0 - 15.0 g/dL   HCT 46.5 (H) 36.0 - 46.0 %   MCV 92.8 78.0 - 100.0 fL   MCH 31.7 26.0 - 34.0 pg   MCHC 34.2 30.0 - 36.0 g/dL   RDW  12.6 11.5 - 15.5 %   Platelets 336 150 - 400 K/uL   Neutrophils Relative % 88 %   Neutro Abs 9.0 (H) 1.7 - 7.7 K/uL   Lymphocytes Relative 10 %   Lymphs Abs 1.0 0.7 - 4.0 K/uL   Monocytes Relative 2 %   Monocytes Absolute 0.3 0.1 - 1.0 K/uL   Eosinophils Relative 0 %   Eosinophils Absolute 0.0 0.0 - 0.7 K/uL   Basophils Relative 0 %   Basophils Absolute 0.0 0.0 - 0.1 K/uL   Ct Renal Stone Study 04/14/2015  CLINICAL DATA:  Low back pain radiating to right hip since seizure 1 month ago. Vomiting. EXAM: CT ABDOMEN AND PELVIS WITHOUT CONTRAST TECHNIQUE: Multidetector CT imaging of the abdomen and pelvis was performed following the standard protocol without IV contrast. COMPARISON:  07/28/2014 FINDINGS: Lower chest: Tree-in-bud opacities are seen in both lower lobes and right middle lobe which are new since previous study, suspicious for infectious or inflammatory process. Hepatobiliary: No mass visualized on this un-enhanced exam. Prior cholecystectomy noted. No evidence of biliary dilatation. Pancreas: No mass or inflammatory process identified on this un-enhanced exam. Spleen: Within normal limits in size. Adrenals/Urinary Tract: No evidence of urolithiasis or hydronephrosis. Stomach/Bowel: No evidence of obstruction, inflammatory process, or abnormal fluid collections. Vascular/Lymphatic: No pathologically enlarged lymph nodes. No evidence of abdominal aortic aneurysm. Aortic atherosclerotic plaque noted. Reproductive: Prior hysterectomy noted. Adnexal regions are unremarkable in appearance. Other: None. Musculoskeletal: Mild superior endplate compression fractures of the L1 and L2 vertebral bodies are seen which are new since 07/28/2014. These show some sclerosis common are likely subacute in age. IMPRESSION: No evidence of urolithiasis or hydronephrosis. Superior endplate compression fractures of L1 and L2 vertebral bodies, which are new since 07/28/2014 exam, and likely subacute in age. Clinical  correlation is recommended. Tree-in-bud opacities in both lung bases, suspicious for infectious or inflammatory process ease. Recommend clinical correlation and consider chest radiograph for further evaluation. Electronically Signed   By: Earle Gell M.D.   On: 04/14/2015 17:55  1830:  Workup reassuring. Pt continues to c/o nausea and has vomited a large amount into emesis bag. Abd remains NT, VSS. Will check labs.   2045:  Pt has tol PO well while in the ED without N/V.  No stooling while in the ED.  Abd remains benign, resps easy, VSS. Doubt infectious process in lungs, given pt denies SOB/cough, pt is afebrile with normal WBC count and O2 Sats.  Pt states she feels better and wants to go home now.  Tx symptomatically at this time. Dx and testing d/w pt.  Questions answered.  Verb understanding, agreeable to d/c home with outpt f/u.    Francine Graven, DO 04/16/15 1250

## 2015-04-16 ENCOUNTER — Encounter (HOSPITAL_COMMUNITY): Payer: Self-pay

## 2015-04-16 ENCOUNTER — Observation Stay (HOSPITAL_COMMUNITY)
Admission: EM | Admit: 2015-04-16 | Discharge: 2015-04-16 | Discharge status: Against Medical Advice | Payer: Self-pay | Attending: Emergency Medicine | Admitting: Emergency Medicine

## 2015-04-16 ENCOUNTER — Emergency Department (HOSPITAL_COMMUNITY): Payer: Self-pay

## 2015-04-16 DIAGNOSIS — R112 Nausea with vomiting, unspecified: Principal | ICD-10-CM | POA: Diagnosis present

## 2015-04-16 DIAGNOSIS — E876 Hypokalemia: Secondary | ICD-10-CM

## 2015-04-16 DIAGNOSIS — R1110 Vomiting, unspecified: Secondary | ICD-10-CM

## 2015-04-16 LAB — CBC WITH DIFFERENTIAL/PLATELET
BASOS ABS: 0 10*3/uL (ref 0.0–0.1)
BASOS PCT: 0 %
EOS ABS: 0 10*3/uL (ref 0.0–0.7)
EOS PCT: 0 %
HCT: 44.7 % (ref 36.0–46.0)
Hemoglobin: 15.7 g/dL — ABNORMAL HIGH (ref 12.0–15.0)
Lymphocytes Relative: 15 %
Lymphs Abs: 1.8 10*3/uL (ref 0.7–4.0)
MCH: 32.4 pg (ref 26.0–34.0)
MCHC: 35.1 g/dL (ref 30.0–36.0)
MCV: 92.4 fL (ref 78.0–100.0)
MONO ABS: 0.6 10*3/uL (ref 0.1–1.0)
MONOS PCT: 5 %
Neutro Abs: 9.2 10*3/uL — ABNORMAL HIGH (ref 1.7–7.7)
Neutrophils Relative %: 80 %
PLATELETS: 306 10*3/uL (ref 150–400)
RBC: 4.84 MIL/uL (ref 3.87–5.11)
RDW: 12.5 % (ref 11.5–15.5)
WBC: 11.6 10*3/uL — ABNORMAL HIGH (ref 4.0–10.5)

## 2015-04-16 LAB — COMPREHENSIVE METABOLIC PANEL
ALBUMIN: 4.1 g/dL (ref 3.5–5.0)
ALK PHOS: 102 U/L (ref 38–126)
ALT: 24 U/L (ref 14–54)
ANION GAP: 15 (ref 5–15)
AST: 29 U/L (ref 15–41)
BILIRUBIN TOTAL: 0.7 mg/dL (ref 0.3–1.2)
BUN: 25 mg/dL — ABNORMAL HIGH (ref 6–20)
CALCIUM: 9.4 mg/dL (ref 8.9–10.3)
CO2: 30 mmol/L (ref 22–32)
CREATININE: 0.81 mg/dL (ref 0.44–1.00)
Chloride: 97 mmol/L — ABNORMAL LOW (ref 101–111)
GFR calc Af Amer: >60 mL/min (ref >60)
GFR calc non Af Amer: >60 mL/min (ref >60)
GLUCOSE: 126 mg/dL — AB (ref 65–99)
Potassium: 2.8 mmol/L — ABNORMAL LOW (ref 3.5–5.1)
SODIUM: 142 mmol/L (ref 135–145)
TOTAL PROTEIN: 8.1 g/dL (ref 6.5–8.1)

## 2015-04-16 LAB — TROPONIN I

## 2015-04-16 LAB — MAGNESIUM: MAGNESIUM: 2.3 mg/dL (ref 1.7–2.4)

## 2015-04-16 LAB — LIPASE, BLOOD: LIPASE: 21 U/L (ref 11–51)

## 2015-04-16 MED ORDER — SODIUM CHLORIDE 0.9 % IV SOLN: INTRAVENOUS | Status: DC

## 2015-04-16 MED ORDER — ACETAMINOPHEN 500 MG PO TABS
1000.0000 mg | ORAL_TABLET | Freq: Once | ORAL | Status: AC
  Administered 2015-04-16: 1000 mg via ORAL
  Filled 2015-04-16: qty 2

## 2015-04-16 MED ORDER — ONDANSETRON 4 MG PO TBDP
4.0000 mg | ORAL_TABLET | Freq: Once | ORAL | Status: AC
Start: 2015-04-16 — End: 2015-04-16
  Administered 2015-04-16: 4 mg via ORAL
  Filled 2015-04-16: qty 1

## 2015-04-16 MED ORDER — ONDANSETRON 8 MG PO TBDP
8.0000 mg | ORAL_TABLET | Freq: Once | ORAL | Status: AC
  Administered 2015-04-16: 8 mg via ORAL
  Filled 2015-04-16: qty 1

## 2015-04-16 MED ORDER — POTASSIUM CHLORIDE CRYS ER 20 MEQ PO TBCR
40.0000 meq | EXTENDED_RELEASE_TABLET | Freq: Once | ORAL | Status: AC
  Administered 2015-04-16: 40 meq via ORAL
  Filled 2015-04-16: qty 2

## 2015-04-16 MED ORDER — PROMETHAZINE HCL 25 MG RE SUPP
25.0000 mg | Freq: Once | RECTAL | Status: AC
  Administered 2015-04-16: 25 mg via RECTAL
  Filled 2015-04-16: qty 1

## 2015-04-16 NOTE — ED Notes (Signed)
Pt at nurses desk stating she was going to leave. Stated the doctor told her she could not be admitted with a low potassium so she was leaving. Pt encouraged to stay but declined. EDP aware

## 2015-04-16 NOTE — ED Notes (Signed)
Instructed patient on proper insertion of suppository. Demonstrated with patient. Patient verbaly explained, and demonstrated back to me.

## 2015-04-16 NOTE — ED Notes (Signed)
Pt reports n/v since Sunday.   Denies diarrhea.  Pt says was here recently for same but says is unable to keep the medications down that she was prescribed.

## 2015-04-16 NOTE — ED Notes (Signed)
Patient left AMA.

## 2015-04-16 NOTE — ED Notes (Signed)
EKG shown to Dr. Thurnell Garbe. No new orders given.

## 2015-04-16 NOTE — ED Notes (Signed)
Patient states she wants something for pain. States she will take Toradol or anything.

## 2015-04-16 NOTE — ED Provider Notes (Signed)
CSN: [id number]     Arrival date & time 04/16/15  1155 History   First MD Initiated Contact with Patient 04/16/15 1220     Chief Complaint  Patient presents with  . Emesis      HPI Pt was seen at 1225. Per pt, c/o gradual onset and persistence of multiple intermittent episodes of acute flair of her chronic N/V that began 2 days ago. Denies abd pain, no CP/SOB, no new back pain from baseline, no fevers, no black or blood in stools or emesis. The symptoms have been associated with no other complaints. The patient has a significant history of similar symptoms previously, recently being evaluated for this complaint and multiple prior evals for same.      Past Medical History  Diagnosis Date  . Colitis 11/2010    ?  [name] Lupus ([place])   . DDD (degenerative disc disease)   . Pancreatitis 2012    [name]  . Uterine cancer ([place]) 1996  . Adenomatous polyp of colon 02/2010  . PUD (peptic ulcer disease)   . COPD (chronic obstructive pulmonary disease) ([place])   . Chronic abdominal pain   . Polysubstance abuse     see admission 03/27/12  . Nausea and vomiting     chronic, recurrent  . Chronic back pain    Past Surgical History  Procedure Laterality Date  . Abdominal hysterectomy  1996    w/ right salpingoopherectomy  . Appendectomy  1977  . Cholecystectomy  1987  . Cesarean section  1987  . Abdominal exploration surgery  1996    fibroid  . Colonoscopy w/ biopsies  02/2010    Dr Christoper Fabian, Mikel Cella  . Esophagogastroduodenoscopy  02/2010    Dr Donnamae Jude  . Shoulder surgery    . Esophagogastroduodenoscopy  09/21/11    Rourk-mild erosive esophagitis, small hiatal hernia, esophageal nodules  . Esophagogastroduodenoscopy  09/20/2011    Procedure: ESOPHAGOGASTRODUODENOSCOPY (EGD);  Surgeon: Daneil Dolin, MD;  [facility];  Service: Endoscopy;  Laterality: N/A;  . Multiple extractions with alveoloplasty  03/06/2012    Procedure: MULTIPLE EXTRACION WITH ALVEOLOPLASTY;  Surgeon: Gae Bon, DDS;  Location: [facility];  Service: Oral Surgery;  Laterality: Bilateral;  PALATAL EXCISE, PALATAL LESIONS WITH BIOPSY   Family History  Problem Relation Age of Onset  . Ulcers Mother   . Colon cancer Maternal Grandmother   . CAD Mother   . Cancer - Lung Father 61  . Hypertension Mother    Social History  Substance Use Topics  . Smoking status: Current Every Day Smoker -- 0.50 packs/day for 25 years    Types: Cigarettes  . Smokeless tobacco: None  . Alcohol Use: No    Review of Systems ROS: Statement: All systems negative except as marked or noted in the HPI; Constitutional: Negative for fever and chills. ; ; Eyes: Negative for eye pain, redness and discharge. ; ; ENMT: Negative for ear pain, hoarseness, nasal congestion, sinus pressure and sore throat. ; ; Cardiovascular: Negative for chest pain, palpitations, diaphoresis, dyspnea and peripheral edema. ; ; Respiratory: Negative for cough, wheezing and stridor. ; ; Gastrointestinal: +N/V. Negative for diarrhea, abdominal pain, blood in stool, hematemesis, jaundice and rectal bleeding. . ; ; Genitourinary: Negative for dysuria, flank pain and hematuria. ; ; Musculoskeletal: Negative for back pain and neck pain. Negative for swelling and trauma.; ; Skin: Negative for pruritus, rash, abrasions, blisters, bruising and skin lesion.; ; Neuro: Negative for headache, lightheadedness and neck stiffness. Negative  for weakness, altered level of consciousness , altered mental status, extremity weakness, paresthesias, involuntary movement, seizure and syncope.     Allergies  Honey bee venom; Ibuprofen; Ketorolac tromethamine; Other; and Penicillins  Home Medications   Prior to Admission medications   Medication Sig Start Date End Date Taking? Authorizing Provider  albuterol (PROVENTIL HFA;VENTOLIN HFA) 108 (90 BASE) MCG/ACT inhaler Inhale 2 puffs into the lungs every 6 (six) hours as needed. Shortness of Breath    Historical Provider, MD   HYDROcodone-acetaminophen (NORCO/VICODIN) 5-325 MG tablet 1 or 2 tabs PO q6 hours prn pain 04/14/15   Francine Graven, DO  methocarbamol (ROBAXIN) 500 MG tablet Take 2 tablets (1,000 mg total) by mouth 4 (four) times daily as needed for muscle spasms (muscle spasm/pain). 04/14/15   Francine Graven, DO  ondansetron (ZOFRAN ODT) 4 MG disintegrating tablet Take 1 tablet (4 mg total) by mouth every 8 (eight) hours as needed for nausea or vomiting. 04/14/15   Francine Graven, DO  promethazine (PHENERGAN) 25 MG suppository Place 1 suppository (25 mg total) rectally every 6 (six) hours as needed for nausea or vomiting. 04/14/15   Francine Graven, DO   BP 95/55 mmHg  Pulse 112  Temp(Src) 98.3 F (36.8 C) (Oral)  Resp 20  Ht 5\' 7"  (1.702 m)  Wt 135 lb (61.236 kg)  BMI 21.14 kg/m2  SpO2 92%   BP 110/69 mmHg  Pulse 88  Temp(Src) 98.1 F (36.7 C) (Oral)  Resp 14  Ht 5\' 7"  (1.702 m)  Wt 135 lb (61.236 kg)  BMI 21.14 kg/m2  SpO2 99%   Physical Exam  1230: Physical examination:  Nursing notes reviewed; Vital signs and O2 SAT reviewed;  Constitutional: Well developed, Well nourished, Well hydrated, In no acute distress; Head:  Normocephalic, atraumatic; Eyes: EOMI, PERRL, No scleral icterus; ENMT: Mouth and pharynx normal, Mucous membranes moist; Neck: Supple, Full range of motion, No lymphadenopathy; Cardiovascular: Tachycardic rate and rhythm, No murmur, rub, or gallop; Respiratory: Breath sounds clear & equal bilaterally, No rales, rhonchi, wheezes.  Speaking full sentences with ease, Normal respiratory effort/excursion; Chest: Nontender, Movement normal; Abdomen: Soft, Nontender, Nondistended, Normal bowel sounds; Genitourinary: No CVA tenderness; Extremities: Pulses normal, No tenderness, No edema, No calf edema or asymmetry.; Neuro: AA&Ox3, Major CN grossly intact.  Speech clear. No gross focal motor or sensory deficits in extremities.; Skin: Color normal, Warm, Dry.   ED Course  Procedures  (including critical care time) Labs Review   Imaging Review  I have personally reviewed and evaluated these images and lab results as part of my medical decision-making.   EKG Interpretation   Date/Time:  Wednesday April 16 2015 12:11:08 EST Ventricular Rate:  122 PR Interval:  128 QRS Duration: 79 QT Interval:  281 QTC Calculation: 400 R Axis:   74 Text Interpretation:  Sinus tachycardia LAE, consider biatrial enlargement  Baseline wander When compared with ECG of 03/26/2012 Rate faster and   Baseline wander is now Present Reconfirmed by [facility]  MD, Ameliyah Sarno  [phone]) on 04/16/2015 1:08:03 PM    EKG Interpretation  Date/Time:  Wednesday April 16 2015 13:27:40 EST Ventricular Rate:  91 PR Interval:  152 QRS Duration: 79 QT Interval:  384 QTC Calculation: 472 R Axis:   82 Text Interpretation:  Sinus rhythm Probable left atrial enlargement Artifact Since last tracing of earlier today Rate slower and Baseline wander is no longer present When compared with ECG of 03/26/2012 No significant change was found Confirmed by [facility]  MD, Nunzio Cory [phone])  on 04/16/2015 1:39:37 PM          MDM  MDM Reviewed: previous chart, nursing note and vitals Reviewed previous: labs, ECG and CT scan Interpretation: labs, ECG and x-ray   Results for orders placed or performed during the hospital encounter of 04/16/15  Comprehensive metabolic panel  Result Value Ref Range   Sodium 142 135 - 145 mmol/L   Potassium 2.8 (L) 3.5 - 5.1 mmol/L   Chloride 97 (L) 101 - 111 mmol/L   CO2 30 22 - 32 mmol/L   Glucose, Bld 126 (H) 65 - 99 mg/dL   BUN 25 (H) 6 - 20 mg/dL   Creatinine, Ser 0.81 0.44 - 1.00 mg/dL   Calcium 9.4 8.9 - 10.3 mg/dL   Total Protein 8.1 6.5 - 8.1 g/dL   Albumin 4.1 3.5 - 5.0 g/dL   AST 29 15 - 41 U/L   ALT 24 14 - 54 U/L   Alkaline Phosphatase 102 38 - 126 U/L   Total Bilirubin 0.7 0.3 - 1.2 mg/dL   GFR calc non Af Amer >60 >60 mL/min   GFR calc Af Amer >60 >60  mL/min   Anion gap 15 5 - 15  CBC with Differential  Result Value Ref Range   WBC 11.6 (H) 4.0 - 10.5 K/uL   RBC 4.84 3.87 - 5.11 MIL/uL   Hemoglobin 15.7 (H) 12.0 - 15.0 g/dL   HCT 44.7 36.0 - 46.0 %   MCV 92.4 78.0 - 100.0 fL   MCH 32.4 26.0 - 34.0 pg   MCHC 35.1 30.0 - 36.0 g/dL   RDW 12.5 11.5 - 15.5 %   Platelets 306 150 - 400 K/uL   Neutrophils Relative % 80 %   Neutro Abs 9.2 (H) 1.7 - 7.7 K/uL   Lymphocytes Relative 15 %   Lymphs Abs 1.8 0.7 - 4.0 K/uL   Monocytes Relative 5 %   Monocytes Absolute 0.6 0.1 - 1.0 K/uL   Eosinophils Relative 0 %   Eosinophils Absolute 0.0 0.0 - 0.7 K/uL   Basophils Relative 0 %   Basophils Absolute 0.0 0.0 - 0.1 K/uL  Lipase, blood  Result Value Ref Range   Lipase 21 11 - 51 U/L  Troponin I  Result Value Ref Range   Troponin I <0.03 <0.031 ng/mL   Dg Abd Acute W/chest 04/16/2015  CLINICAL DATA:  Nausea and vomiting starting Sunday, history of pancreatitis EXAM: DG ABDOMEN ACUTE W/ 1V CHEST COMPARISON:  04/14/2015 FINDINGS: Cardiomediastinal silhouette is stable. Hyperinflation again noted. No acute infiltrate or pulmonary edema. There is normal small bowel gas pattern. Again noted compression fractures upper endplate of L1 and L2 vertebral bodies. IMPRESSION: Negative abdominal radiographs. No acute cardiopulmonary disease. Again noted hyperinflation. Electronically Signed   By: Lahoma Crocker M.D.   On: 04/16/2015 14:15    1235:  Pt stated to myself and ED RN that she "couldn't keep down" the anti-emetic medicines she was rx after her ED visit 2 days ago. Pt reminded that she was given suppositories and ODT medications. Pt then stated the "suppository kept falling out of me." Will dose PR phenergan here (teach/treat). Pt aware she will not be receiving narcotic medications while in the ED today or by rx; pt verb understanding.   1550:  Pt continues to c/o N/V despite multiple doses of anti-emetics. Will add magnesium level, observation admit. T/C  to [facility] Dr. Anastasio Champion, case discussed, including:  HPI, pertinent PM/SHx, VS/PE, dx testing, ED course and  treatment:  Agreeable to admit, requests to write temporary orders, obtain observation tele bed to team [facility].   1555:  ED RN states pt walked out of the ED because she didn't want to be admitted.    Francine Graven, DO 04/22/15 [phone]

## 2015-08-24 ENCOUNTER — Encounter (HOSPITAL_COMMUNITY): Payer: Self-pay | Admitting: *Deleted

## 2015-08-24 ENCOUNTER — Emergency Department (HOSPITAL_COMMUNITY)
Admission: EM | Admit: 2015-08-24 | Discharge: 2015-08-24 | Disposition: A | Discharge status: Home / Self Care | Payer: Self-pay | Attending: Emergency Medicine | Admitting: Emergency Medicine

## 2015-08-24 DIAGNOSIS — Z8542 Personal history of malignant neoplasm of other parts of uterus: Secondary | ICD-10-CM | POA: Insufficient documentation

## 2015-08-24 DIAGNOSIS — G8929 Other chronic pain: Secondary | ICD-10-CM | POA: Insufficient documentation

## 2015-08-24 DIAGNOSIS — Z9889 Other specified postprocedural states: Secondary | ICD-10-CM | POA: Insufficient documentation

## 2015-08-24 DIAGNOSIS — R109 Unspecified abdominal pain: Secondary | ICD-10-CM

## 2015-08-24 DIAGNOSIS — R112 Nausea with vomiting, unspecified: Secondary | ICD-10-CM | POA: Insufficient documentation

## 2015-08-24 DIAGNOSIS — R1013 Epigastric pain: Secondary | ICD-10-CM | POA: Insufficient documentation

## 2015-08-24 DIAGNOSIS — J440 Chronic obstructive pulmonary disease with acute lower respiratory infection: Secondary | ICD-10-CM | POA: Insufficient documentation

## 2015-08-24 DIAGNOSIS — F17210 Nicotine dependence, cigarettes, uncomplicated: Secondary | ICD-10-CM | POA: Insufficient documentation

## 2015-08-24 DIAGNOSIS — Z7951 Long term (current) use of inhaled steroids: Secondary | ICD-10-CM | POA: Insufficient documentation

## 2015-08-24 LAB — COMPREHENSIVE METABOLIC PANEL
ALBUMIN: 5 g/dL (ref 3.5–5.0)
ALK PHOS: 67 U/L (ref 38–126)
ALT: 30 U/L (ref 14–54)
AST: 25 U/L (ref 15–41)
Anion gap: 12 (ref 5–15)
BILIRUBIN TOTAL: 0.9 mg/dL (ref 0.3–1.2)
BUN: 20 mg/dL (ref 6–20)
CALCIUM: 9.6 mg/dL (ref 8.9–10.3)
CO2: 26 mmol/L (ref 22–32)
CREATININE: 0.83 mg/dL (ref 0.44–1.00)
Chloride: 102 mmol/L (ref 101–111)
GFR calc non Af Amer: >60 mL/min (ref >60)
GLUCOSE: 141 mg/dL — AB (ref 65–99)
Potassium: 3.8 mmol/L (ref 3.5–5.1)
SODIUM: 140 mmol/L (ref 135–145)
TOTAL PROTEIN: 9.2 g/dL — AB (ref 6.5–8.1)

## 2015-08-24 LAB — URINALYSIS, ROUTINE W REFLEX MICROSCOPIC
GLUCOSE, UA: NEGATIVE mg/dL
HGB URINE DIPSTICK: NEGATIVE
KETONES UR: 15 mg/dL — AB
Leukocytes, UA: NEGATIVE
Nitrite: NEGATIVE
PH: 6.5 (ref 5.0–8.0)
PROTEIN: 30 mg/dL — AB
Specific Gravity, Urine: 1.02 (ref 1.005–1.030)

## 2015-08-24 LAB — CBC WITH DIFFERENTIAL/PLATELET
BASOS PCT: 0 %
Basophils Absolute: 0 10*3/uL (ref 0.0–0.1)
EOS ABS: 0.1 10*3/uL (ref 0.0–0.7)
EOS PCT: 1 %
HEMATOCRIT: 43.9 % (ref 36.0–46.0)
Hemoglobin: 14.2 g/dL (ref 12.0–15.0)
Lymphocytes Relative: 14 %
Lymphs Abs: 1 10*3/uL (ref 0.7–4.0)
MCH: 30 pg (ref 26.0–34.0)
MCHC: 32.3 g/dL (ref 30.0–36.0)
MCV: 92.8 fL (ref 78.0–100.0)
MONO ABS: 0.2 10*3/uL (ref 0.1–1.0)
MONOS PCT: 3 %
Neutro Abs: 6.2 10*3/uL (ref 1.7–7.7)
Neutrophils Relative %: 82 %
PLATELETS: 317 10*3/uL (ref 150–400)
RBC: 4.73 MIL/uL (ref 3.87–5.11)
RDW: 12.8 % (ref 11.5–15.5)
WBC: 7.6 10*3/uL (ref 4.0–10.5)

## 2015-08-24 LAB — URINE MICROSCOPIC-ADD ON
Bacteria, UA: NONE SEEN
WBC UA: NONE SEEN WBC/hpf (ref 0–5)

## 2015-08-24 LAB — LIPASE, BLOOD: Lipase: 18 U/L (ref 11–51)

## 2015-08-24 MED ORDER — ONDANSETRON 4 MG PO TBDP: 4.0000 mg | ORAL_TABLET | Freq: Three times a day (TID) | ORAL | Status: AC | PRN

## 2015-08-24 MED ORDER — SODIUM CHLORIDE 0.9 % IV BOLUS (SEPSIS)
1000.0000 mL | Freq: Once | INTRAVENOUS | Status: AC
  Administered 2015-08-24: 1000 mL via INTRAVENOUS

## 2015-08-24 MED ORDER — ONDANSETRON HCL 4 MG/2ML IJ SOLN
4.0000 mg | Freq: Once | INTRAMUSCULAR | Status: AC | PRN
  Administered 2015-08-24: 4 mg via INTRAVENOUS
  Filled 2015-08-24: qty 2

## 2015-08-24 MED ORDER — METOCLOPRAMIDE HCL 5 MG/ML IJ SOLN
10.0000 mg | Freq: Once | INTRAMUSCULAR | Status: AC
  Administered 2015-08-24: 10 mg via INTRAVENOUS
  Filled 2015-08-24: qty 2

## 2015-08-24 MED ORDER — HALOPERIDOL LACTATE 5 MG/ML IJ SOLN
5.0000 mg | Freq: Once | INTRAMUSCULAR | Status: AC
  Administered 2015-08-24: 5 mg via INTRAMUSCULAR
  Filled 2015-08-24: qty 1

## 2015-08-24 NOTE — ED Notes (Signed)
Pt c/o pain at this time.  No vomiting since arrival to ED.  Pt resting quietly in no acute distress.

## 2015-08-24 NOTE — ED Notes (Signed)
Pt states she always has to have Dilaudid for her abdominal pain.  States they gave it to her last week at [facility].

## 2015-08-24 NOTE — Discharge Instructions (Signed)

## 2015-08-24 NOTE — ED Notes (Signed)
Pt states she woke up at 0300 today with upper abdominal pain and persistent vomiting. Denies any diarrhea. States she has a history of gastroparesis

## 2015-08-24 NOTE — ED Provider Notes (Signed)
CSN: [id number]     Arrival date & time 08/24/15  0800 History   First MD Initiated Contact with Patient 08/24/15 [phone]     Chief Complaint  Patient presents with  . Abdominal Pain     (Consider location/radiation/quality/duration/timing/severity/associated sxs/prior Treatment) HPI   Andrea Nelson is a 50 y.o. female with hx of chronic abdominal pain, pancreatitis, lupus who presents to the Emergency Department complaining of sudden onset of upper abdominal pain since 2:00 am this morning.  Pain is associated with nausea and vomiting and feel similar to previous episodes of gastroparesis.  She describes a sharp pain that radiates into her back at times.  She has been unable to tolerate any food or liquids since onset.  She reports multiple episodes of vomiting and states she feels dehydrated.  She denies chest pain, shortness of breath, bloody or black stools, diarrhea.  She has seen Dr. Gala Romney in the past for same and has an appt with a GI specialist in [facility] next week   Past Medical History  Diagnosis Date  . Colitis 11/2010    ?  [name] Lupus ([place])   . DDD (degenerative disc disease)   . Pancreatitis 2012    [name]  . Uterine cancer ([place]) 1996  . Adenomatous polyp of colon 02/2010  . PUD (peptic ulcer disease)   . COPD (chronic obstructive pulmonary disease) ([place])   . Chronic abdominal pain   . Polysubstance abuse     see admission 03/27/12  . Nausea and vomiting     chronic, recurrent  . Chronic back pain    Past Surgical History  Procedure Laterality Date  . Abdominal hysterectomy  1996    w/ right salpingoopherectomy  . Appendectomy  1977  . Cholecystectomy  1987  . Cesarean section  1987  . Abdominal exploration surgery  1996    fibroid  . Colonoscopy w/ biopsies  02/2010    Dr Christoper Fabian, Mikel Cella  . Esophagogastroduodenoscopy  02/2010    Dr Donnamae Jude  . Shoulder surgery    . Esophagogastroduodenoscopy  09/21/11    Rourk-mild erosive esophagitis, small hiatal  hernia, esophageal nodules  . Esophagogastroduodenoscopy  09/20/2011    Procedure: ESOPHAGOGASTRODUODENOSCOPY (EGD);  Surgeon: Daneil Dolin, MD;  [facility];  Service: Endoscopy;  Laterality: N/A;  . Multiple extractions with alveoloplasty  03/06/2012    Procedure: MULTIPLE EXTRACION WITH ALVEOLOPLASTY;  Surgeon: Gae Bon, DDS;  Location: [place];  Service: Oral Surgery;  Laterality: Bilateral;  PALATAL EXCISE, PALATAL LESIONS WITH BIOPSY   Family History  Problem Relation Age of Onset  . Ulcers Mother   . Colon cancer Maternal Grandmother   . CAD Mother   . Cancer - Lung Father 54  . Hypertension Mother    Social History  Substance Use Topics  . Smoking status: Current Every Day Smoker -- 0.50 packs/day for 25 years    Types: Cigarettes  . Smokeless tobacco: None  . Alcohol Use: No   OB History    No data available     Review of Systems  Constitutional: Negative for fever, chills and appetite change.  Respiratory: Negative for shortness of breath.   Cardiovascular: Negative for chest pain.  Gastrointestinal: Positive for nausea, vomiting and abdominal pain. Negative for diarrhea and blood in stool.  Genitourinary: Negative for dysuria, flank pain, decreased urine volume and difficulty urinating.  Musculoskeletal: Negative for myalgias and back pain.  Skin: Negative for color change and rash.  Neurological: Negative for  dizziness, weakness and numbness.  Hematological: Negative for adenopathy.  All other systems reviewed and are negative.     Allergies  Honey bee venom; Ibuprofen; Ketorolac tromethamine; Other; and Penicillins  Home Medications   Prior to Admission medications   Medication Sig Start Date End Date Taking? Authorizing Provider  albuterol (PROVENTIL HFA;VENTOLIN HFA) 108 (90 BASE) MCG/ACT inhaler Inhale 2 puffs into the lungs every 6 (six) hours as needed. Shortness of Breath    Historical Provider, MD  HYDROcodone-acetaminophen  (NORCO/VICODIN) 5-325 MG tablet 1 or 2 tabs PO q6 hours prn pain 04/14/15   Francine Graven, DO  methocarbamol (ROBAXIN) 500 MG tablet Take 2 tablets (1,000 mg total) by mouth 4 (four) times daily as needed for muscle spasms (muscle spasm/pain). 04/14/15   Francine Graven, DO  ondansetron (ZOFRAN ODT) 4 MG disintegrating tablet Take 1 tablet (4 mg total) by mouth every 8 (eight) hours as needed for nausea or vomiting. 04/14/15   Francine Graven, DO  promethazine (PHENERGAN) 25 MG suppository Place 1 suppository (25 mg total) rectally every 6 (six) hours as needed for nausea or vomiting. 04/14/15   Francine Graven, DO   BP 131/84 mmHg  Pulse 110  Temp(Src) 98.2 F (36.8 C) (Oral)  Resp 16  Ht 5\' 7"  (1.702 m)  Wt 58.968 kg  BMI 20.36 kg/m2  SpO2 96% Physical Exam  Constitutional: She is oriented to person, place, and time. She appears well-developed and well-nourished. No distress.  HENT:  Head: Normocephalic and atraumatic.  Mouth/Throat: Uvula is midline and oropharynx is clear and moist. Mucous membranes are dry.  Neck: Normal range of motion. No thyromegaly present.  Cardiovascular: Normal rate, regular rhythm, normal heart sounds and intact distal pulses.   No murmur heard. Pulmonary/Chest: Effort normal and breath sounds normal. No respiratory distress.  Abdominal: Soft. Normal appearance and bowel sounds are normal. She exhibits no distension and no mass. There is tenderness in the epigastric area. There is no rigidity, no rebound, no guarding, no CVA tenderness and no tenderness at McBurney's point.  Musculoskeletal: Normal range of motion. She exhibits no edema.  Neurological: She is alert and oriented to person, place, and time. She exhibits normal muscle tone. Coordination normal.  Skin: Skin is warm and dry.  Psychiatric: She has a normal mood and affect.  Nursing note and vitals reviewed.   ED Course  Procedures (including critical care time) Labs Review Labs Reviewed   COMPREHENSIVE METABOLIC PANEL - Abnormal; Notable for the following:    Glucose, Bld 141 (*)    Total Protein 9.2 (*)    All other components within normal limits  URINALYSIS, ROUTINE W REFLEX MICROSCOPIC (NOT AT [facility]) - Abnormal; Notable for the following:    Bilirubin Urine SMALL (*)    Ketones, ur 15 (*)    Protein, ur 30 (*)    All other components within normal limits  URINE MICROSCOPIC-ADD ON - Abnormal; Notable for the following:    Squamous Epithelial / LPF 6-30 (*)    All other components within normal limits  LIPASE, BLOOD  CBC WITH DIFFERENTIAL/PLATELET    Imaging Review No results found. I have personally reviewed and evaluated these images and lab results as part of my medical decision-making.   EKG Interpretation None      MDM   Final diagnoses:  Chronic abdominal pain  Non-intractable vomiting with nausea, vomiting of unspecified type    Pt with chronic abdominal pain and reported vomiting at home.  No concerning sx's  for surgical abdomen.  Pt has hx of same and multiple ED visits.  Will rehydrate, ck labs and re evaluate.  Pt has received IVF's and reports nausea improved, but continues to report pain and requesting Dilaudid stating that is what she normally gets when she has these "attacks" I have explained that narcotics are not indicated for chronic pain and she was given anti-emetics and IM Haldol.  No vomiting witnessed during ED stay.      11:00  Pt continues to request pain medication. I have addressed pt's sx's and she appears stable.  I offered admission, but she refused, stating that she wants to go home.  Rx given for zofran and agrees to f/u with GI or PMD.  Return precautions also given     Kem Parkinson, PA-C 08/24/15 [place], DO 08/25/15 2135

## 2016-02-29 ENCOUNTER — Emergency Department (HOSPITAL_COMMUNITY): Payer: Self-pay

## 2016-02-29 ENCOUNTER — Encounter (HOSPITAL_COMMUNITY): Payer: Self-pay | Admitting: Emergency Medicine

## 2016-02-29 ENCOUNTER — Emergency Department (HOSPITAL_COMMUNITY)
Admission: EM | Admit: 2016-02-29 | Discharge: 2016-02-29 | Disposition: A | Discharge status: Home / Self Care | Payer: Self-pay | Attending: Dermatology | Admitting: Dermatology

## 2016-02-29 DIAGNOSIS — F17210 Nicotine dependence, cigarettes, uncomplicated: Secondary | ICD-10-CM | POA: Insufficient documentation

## 2016-02-29 DIAGNOSIS — Z5321 Procedure and treatment not carried out due to patient leaving prior to being seen by health care provider: Secondary | ICD-10-CM | POA: Insufficient documentation

## 2016-02-29 DIAGNOSIS — Z8542 Personal history of malignant neoplasm of other parts of uterus: Secondary | ICD-10-CM | POA: Insufficient documentation

## 2016-02-29 DIAGNOSIS — R079 Chest pain, unspecified: Secondary | ICD-10-CM | POA: Insufficient documentation

## 2016-02-29 DIAGNOSIS — R197 Diarrhea, unspecified: Secondary | ICD-10-CM | POA: Insufficient documentation

## 2016-02-29 DIAGNOSIS — Z79899 Other long term (current) drug therapy: Secondary | ICD-10-CM | POA: Insufficient documentation

## 2016-02-29 DIAGNOSIS — J449 Chronic obstructive pulmonary disease, unspecified: Secondary | ICD-10-CM | POA: Insufficient documentation

## 2016-02-29 DIAGNOSIS — R112 Nausea with vomiting, unspecified: Secondary | ICD-10-CM | POA: Insufficient documentation

## 2016-02-29 NOTE — ED Triage Notes (Signed)
Pt reports n/v for 1 week. Diarrhea and chest pain that started this am. EKG completed in Triage.

## 2019-08-12 DIAGNOSIS — F17210 Nicotine dependence, cigarettes, uncomplicated: Secondary | ICD-10-CM | POA: Insufficient documentation

## 2019-08-12 DIAGNOSIS — R197 Diarrhea, unspecified: Secondary | ICD-10-CM | POA: Insufficient documentation

## 2019-08-12 DIAGNOSIS — J449 Chronic obstructive pulmonary disease, unspecified: Secondary | ICD-10-CM | POA: Diagnosis not present

## 2019-08-12 DIAGNOSIS — E860 Dehydration: Secondary | ICD-10-CM | POA: Insufficient documentation

## 2019-08-12 DIAGNOSIS — R112 Nausea with vomiting, unspecified: Secondary | ICD-10-CM | POA: Diagnosis present

## 2019-08-12 DIAGNOSIS — R1010 Upper abdominal pain, unspecified: Secondary | ICD-10-CM | POA: Diagnosis not present

## 2019-08-12 DIAGNOSIS — Z8542 Personal history of malignant neoplasm of other parts of uterus: Secondary | ICD-10-CM | POA: Insufficient documentation

## 2019-08-12 DIAGNOSIS — F1910 Other psychoactive substance abuse, uncomplicated: Secondary | ICD-10-CM | POA: Insufficient documentation

## 2019-08-12 DIAGNOSIS — F1123 Opioid dependence with withdrawal: Secondary | ICD-10-CM | POA: Insufficient documentation

## 2019-08-13 ENCOUNTER — Other Ambulatory Visit: Payer: Self-pay

## 2019-08-13 ENCOUNTER — Emergency Department (HOSPITAL_COMMUNITY)
Admission: EM | Admit: 2019-08-13 | Discharge: 2019-08-13 | Disposition: A | Discharge status: Home / Self Care | Attending: Emergency Medicine | Admitting: Emergency Medicine

## 2019-08-13 ENCOUNTER — Encounter (HOSPITAL_COMMUNITY): Payer: Self-pay | Admitting: *Deleted

## 2019-08-13 DIAGNOSIS — F1193 Opioid use, unspecified with withdrawal: Secondary | ICD-10-CM

## 2019-08-13 DIAGNOSIS — F1123 Opioid dependence with withdrawal: Secondary | ICD-10-CM

## 2019-08-13 DIAGNOSIS — R112 Nausea with vomiting, unspecified: Secondary | ICD-10-CM

## 2019-08-13 DIAGNOSIS — E860 Dehydration: Secondary | ICD-10-CM

## 2019-08-13 LAB — CBC WITH DIFFERENTIAL/PLATELET
Abs Immature Granulocytes: 0.05 10*3/uL (ref 0.00–0.07)
Basophils Absolute: 0 10*3/uL (ref 0.0–0.1)
Basophils Relative: 0 %
Eosinophils Absolute: 0 10*3/uL (ref 0.0–0.5)
Eosinophils Relative: 0 %
HCT: 51.4 % — ABNORMAL HIGH (ref 36.0–46.0)
Hemoglobin: 16.7 g/dL — ABNORMAL HIGH (ref 12.0–15.0)
Immature Granulocytes: 1 %
Lymphocytes Relative: 10 %
Lymphs Abs: 1.1 10*3/uL (ref 0.7–4.0)
MCH: 30.7 pg (ref 26.0–34.0)
MCHC: 32.5 g/dL (ref 30.0–36.0)
MCV: 94.5 fL (ref 80.0–100.0)
Monocytes Absolute: 0.3 10*3/uL (ref 0.1–1.0)
Monocytes Relative: 2 %
Neutro Abs: 9.4 10*3/uL — ABNORMAL HIGH (ref 1.7–7.7)
Neutrophils Relative %: 87 %
Platelets: ADEQUATE 10*3/uL (ref 150–400)
RBC: 5.44 MIL/uL — ABNORMAL HIGH (ref 3.87–5.11)
RDW: 12.7 % (ref 11.5–15.5)
WBC: 10.9 10*3/uL — ABNORMAL HIGH (ref 4.0–10.5)
nRBC: 0 % (ref 0.0–0.2)

## 2019-08-13 LAB — COMPREHENSIVE METABOLIC PANEL
ALT: 97 U/L — ABNORMAL HIGH (ref 0–44)
AST: 60 U/L — ABNORMAL HIGH (ref 15–41)
Albumin: 4.4 g/dL (ref 3.5–5.0)
Alkaline Phosphatase: 106 U/L (ref 38–126)
Anion gap: 15 (ref 5–15)
BUN: 30 mg/dL — ABNORMAL HIGH (ref 6–20)
CO2: 24 mmol/L (ref 22–32)
Calcium: 9.4 mg/dL (ref 8.9–10.3)
Chloride: 101 mmol/L (ref 98–111)
Creatinine, Ser: 0.65 mg/dL (ref 0.44–1.00)
GFR calc Af Amer: >60 mL/min (ref >60)
GFR calc non Af Amer: >60 mL/min (ref >60)
Glucose, Bld: 96 mg/dL (ref 70–99)
Potassium: 3.8 mmol/L (ref 3.5–5.1)
Sodium: 140 mmol/L (ref 135–145)
Total Bilirubin: 0.8 mg/dL (ref 0.3–1.2)
Total Protein: 8.7 g/dL — ABNORMAL HIGH (ref 6.5–8.1)

## 2019-08-13 MED ORDER — METHOCARBAMOL 500 MG PO TABS: 500.0000 mg | ORAL_TABLET | Freq: Three times a day (TID) | ORAL | Status: DC | PRN

## 2019-08-13 MED ORDER — SODIUM CHLORIDE 0.9 % IV BOLUS
1000.0000 mL | Freq: Once | INTRAVENOUS | Status: AC
  Administered 2019-08-13: 01:00:00 1000 mL via INTRAVENOUS

## 2019-08-13 MED ORDER — SODIUM CHLORIDE 0.9 % IV BOLUS
500.0000 mL | Freq: Once | INTRAVENOUS | Status: AC
  Administered 2019-08-13: 04:00:00 500 mL via INTRAVENOUS

## 2019-08-13 MED ORDER — ONDANSETRON HCL 4 MG/2ML IJ SOLN
4.0000 mg | Freq: Once | INTRAMUSCULAR | Status: AC
  Administered 2019-08-13: 01:00:00 4 mg via INTRAVENOUS
  Filled 2019-08-13: qty 2

## 2019-08-13 MED ORDER — CLONIDINE HCL 0.1 MG PO TABS
0.1000 mg | ORAL_TABLET | Freq: Four times a day (QID) | ORAL | Status: DC
  Administered 2019-08-13: 01:00:00 0.1 mg via ORAL
  Filled 2019-08-13: qty 1

## 2019-08-13 MED ORDER — LOPERAMIDE HCL 2 MG PO CAPS: 2.0000 mg | ORAL_CAPSULE | ORAL | Status: DC | PRN

## 2019-08-13 MED ORDER — ONDANSETRON 4 MG PO TBDP: 4.0000 mg | ORAL_TABLET | Freq: Four times a day (QID) | ORAL | Status: DC | PRN

## 2019-08-13 MED ORDER — SODIUM CHLORIDE 0.9 % IV BOLUS
1000.0000 mL | Freq: Once | INTRAVENOUS | Status: AC
  Administered 2019-08-13: 04:00:00 1000 mL via INTRAVENOUS

## 2019-08-13 MED ORDER — DICYCLOMINE HCL 20 MG PO TABS
20.0000 mg | ORAL_TABLET | Freq: Four times a day (QID) | ORAL | Status: DC | PRN
Start: 2019-08-13 — End: 2019-08-13
  Filled 2019-08-13: qty 1

## 2019-08-13 MED ORDER — ONDANSETRON HCL 4 MG/2ML IJ SOLN
4.0000 mg | Freq: Once | INTRAMUSCULAR | Status: AC
  Administered 2019-08-13: 05:00:00 4 mg via INTRAVENOUS
  Filled 2019-08-13: qty 2

## 2019-08-13 MED ORDER — CLONIDINE HCL 0.1 MG PO TABS: 0.1000 mg | ORAL_TABLET | ORAL | Status: DC

## 2019-08-13 MED ORDER — HYDROXYZINE HCL 25 MG PO TABS
25.0000 mg | ORAL_TABLET | Freq: Four times a day (QID) | ORAL | Status: DC | PRN
Start: 2019-08-13 — End: 2019-08-13

## 2019-08-13 MED ORDER — METOCLOPRAMIDE HCL 5 MG/ML IJ SOLN
5.0000 mg | Freq: Once | INTRAMUSCULAR | Status: AC
  Administered 2019-08-13: 05:00:00 5 mg via INTRAVENOUS
  Filled 2019-08-13: qty 2

## 2019-08-13 MED ORDER — DICYCLOMINE HCL 10 MG/ML IM SOLN
20.0000 mg | Freq: Once | INTRAMUSCULAR | Status: AC
  Administered 2019-08-13: 01:00:00 20 mg via INTRAMUSCULAR
  Filled 2019-08-13: qty 2

## 2019-08-13 MED ORDER — CLONIDINE HCL 0.1 MG PO TABS: 0.1000 mg | ORAL_TABLET | Freq: Every day | ORAL | Status: DC

## 2019-08-13 NOTE — ED Triage Notes (Signed)
[facility] with request for  withdrawal from heroin. C/o n/v/d, body aches, states that she last used heroin on friday

## 2019-08-13 NOTE — Discharge Instructions (Addendum)
Try to drink plenty of fluids.  Use Zofran or Reglan or Phenergan for nausea.  Imodium A-D as needed for diarrhea.  Robaxin 500 mg 4 times a day for muscle aches.  Clonidine 0.1 mg can be used for narcotic withdrawal.  Bentyl can help with abdominal cramping.  Hydroxyzine/Atarax can be used for anxiety.  Look at the resource guide to help you get help to stop your heroin use.

## 2019-08-13 NOTE — ED Provider Notes (Signed)
[facility] EMERGENCY DEPARTMENT Provider Note   CSN: [id number] Arrival date & time: 08/12/19  2345   Time seen 12:15 AM  History Chief Complaint  Patient presents with  . V70.1    Andrea Nelson is a 54 y.o. female.  HPI Patient states she has been doing heroin IV for about 3 to 4 years because "I was in chronic pain and my doctors cut me off of pain medicine".  She was put in jail on Friday, April 2.  And that was the last day she had used heroin.  She started having withdrawal symptoms yesterday.  She states she is having nausea and vomiting and she feels like she is jumping out of her skin.  She states she has sharp upper abdominal pain and she is having diarrhea.  PCP Glenda Chroman, MD     Past Medical History:  Diagnosis Date  . Adenomatous polyp of colon 02/2010  . Chronic abdominal pain   . Chronic back pain   . Colitis 11/2010   ?  [name] COPD (chronic obstructive pulmonary disease) ([place])   . DDD (degenerative disc disease)   . Lupus ([place])   . Nausea and vomiting    chronic, recurrent  . Pancreatitis 2012   [name]  . Polysubstance abuse ([place])    see admission 03/27/12  . PUD (peptic ulcer disease)   . Uterine cancer [facility]) 1996    Patient Active Problem List   Diagnosis Date Noted  . Intractable nausea and vomiting 04/16/2015  . Polysubstance abuse ([place])   . Epigastric pain 09/19/2011  . Persistent vomiting 09/19/2011  . Dehydration 09/19/2011  . History of systemic lupus erythematosus (SLE) ([place]) 09/19/2011    Past Surgical History:  Procedure Laterality Date  . ABDOMINAL EXPLORATION SURGERY  1996   fibroid  . ABDOMINAL HYSTERECTOMY  1996   w/ right salpingoopherectomy  . APPENDECTOMY  1977  . CESAREAN SECTION  1987  . CHOLECYSTECTOMY  1987  . COLONOSCOPY W/ BIOPSIES  02/2010   Dr Christoper Fabian, Mikel Cella  . ESOPHAGOGASTRODUODENOSCOPY  02/2010   Dr Donnamae Jude  . ESOPHAGOGASTRODUODENOSCOPY  09/21/11   Rourk-mild erosive esophagitis, small hiatal hernia,  esophageal nodules  . ESOPHAGOGASTRODUODENOSCOPY  09/20/2011   Procedure: ESOPHAGOGASTRODUODENOSCOPY (EGD);  Surgeon: Daneil Dolin, MD;  [facility];  Service: Endoscopy;  Laterality: N/A;  . MULTIPLE EXTRACTIONS WITH ALVEOLOPLASTY  03/06/2012   Procedure: MULTIPLE EXTRACION WITH ALVEOLOPLASTY;  Surgeon: Gae Bon, DDS;  Location: [place];  Service: Oral Surgery;  Laterality: Bilateral;  PALATAL EXCISE, PALATAL LESIONS WITH BIOPSY  . SHOULDER SURGERY       OB History    Gravida  5   Para  2   Term  1   Preterm  1   AB  3   Living        SAB  3   TAB      Ectopic      Multiple      Live Births              Family History  Problem Relation Age of Onset  . Ulcers Mother   . CAD Mother   . Hypertension Mother   . Colon cancer Maternal Grandmother   . Cancer - Lung Father 74    Social History   Tobacco Use  . Smoking status: Current Every Day Smoker    Packs/day: 1.00    Years: 25.00    Pack years: 25.00  Types: Cigarettes  . Smokeless tobacco: Never Used  Substance Use Topics  . Alcohol use: No  . Drug use: Yes    Types: IV    Comment: heroin     Home Medications Prior to Admission medications   Medication Sig Start Date End Date Taking? Authorizing Provider  albuterol (PROVENTIL HFA;VENTOLIN HFA) 108 (90 BASE) MCG/ACT inhaler Inhale 2 puffs into the lungs every 6 (six) hours as needed. Shortness of Breath    [provider]  HYDROcodone-acetaminophen (NORCO/VICODIN) 5-325 MG tablet 1 or 2 tabs PO q6 hours prn pain Patient not taking: Reported on 08/24/2015 04/14/15   Francine Graven, DO  methocarbamol (ROBAXIN) 500 MG tablet Take 2 tablets (1,000 mg total) by mouth 4 (four) times daily as needed for muscle spasms (muscle spasm/pain). Patient not taking: Reported on 08/24/2015 04/14/15   Francine Graven, DO  ondansetron (ZOFRAN ODT) 4 MG disintegrating tablet Take 1 tablet (4 mg total) by mouth every 8 (eight) hours as  needed for nausea or vomiting. 08/24/15   Triplett, Tammy, PA-C  promethazine (PHENERGAN) 25 MG suppository Place 1 suppository (25 mg total) rectally every 6 (six) hours as needed for nausea or vomiting. Patient not taking: Reported on 08/24/2015 04/14/15   Francine Graven, DO    Allergies    Honey bee venom, Ibuprofen, Ketorolac tromethamine, Other, and Penicillins  Review of Systems   Review of Systems  All other systems reviewed and are negative.   Physical Exam Updated Vital Signs BP 140/77   Pulse 99   Temp 99.1 F (37.3 C)   Resp 20   Ht 5\' 7"  (1.702 m)   Wt 49.9 kg   SpO2 96%   BMI 17.23 kg/m   Physical Exam Vitals and nursing note reviewed.  Constitutional:      Comments: Underweight, gaunt in appearance  HENT:     Head: Normocephalic and atraumatic.     Mouth/Throat:     Mouth: Mucous membranes are dry.  Eyes:     Extraocular Movements: Extraocular movements intact.     Conjunctiva/sclera: Conjunctivae normal.     Pupils: Pupils are equal, round, and reactive to light.  Cardiovascular:     Rate and Rhythm: Regular rhythm. Tachycardia present.  Pulmonary:     Effort: Pulmonary effort is normal. No respiratory distress.     Breath sounds: Normal breath sounds.  Abdominal:     General: Bowel sounds are normal. There is no distension.     Palpations: Abdomen is soft.     Tenderness: There is abdominal tenderness.  Musculoskeletal:        General: Normal range of motion.     Cervical back: Normal range of motion and neck supple.     Comments: Patient has multiple tracks on her arms and bruising.  There is no areas that look acutely infected.  Skin:    General: Skin is warm and dry.  Neurological:     General: No focal deficit present.     Mental Status: She is alert and oriented to person, place, and time.     Cranial Nerves: No cranial nerve deficit.  Psychiatric:        Mood and Affect: Mood is anxious.        Speech: Speech is rapid and pressured.          Behavior: Behavior is agitated.     ED Results / Procedures / Treatments   Labs (all labs ordered are listed, but only abnormal results are  displayed) Labs Reviewed  COMPREHENSIVE METABOLIC PANEL - Abnormal; Notable for the following components:      Result Value   BUN 30 (*)    Total Protein 8.7 (*)    AST 60 (*)    ALT 97 (*)    All other components within normal limits  CBC WITH DIFFERENTIAL/PLATELET - Abnormal; Notable for the following components:   WBC 10.9 (*)    RBC 5.44 (*)    Hemoglobin 16.7 (*)    HCT 51.4 (*)    Neutro Abs 9.4 (*)    All other components within normal limits    EKG None  Radiology No results found.  Procedures Procedures (including critical care time)  Medications Ordered in ED Medications  dicyclomine (BENTYL) tablet 20 mg (has no administration in time range)  hydrOXYzine (ATARAX/VISTARIL) tablet 25 mg (has no administration in time range)  loperamide (IMODIUM) capsule 2-4 mg (has no administration in time range)  methocarbamol (ROBAXIN) tablet 500 mg (has no administration in time range)  ondansetron (ZOFRAN-ODT) disintegrating tablet 4 mg (has no administration in time range)  cloNIDine (CATAPRES) tablet 0.1 mg (0.1 mg Oral Given 08/13/19 0055)    Followed by  cloNIDine (CATAPRES) tablet 0.1 mg (has no administration in time range)    Followed by  cloNIDine (CATAPRES) tablet 0.1 mg (has no administration in time range)  sodium chloride 0.9 % bolus 1,000 mL (0 mLs Intravenous Stopped 08/13/19 0412)  ondansetron (ZOFRAN) injection 4 mg (4 mg Intravenous Given 08/13/19 0100)  dicyclomine (BENTYL) injection 20 mg (20 mg Intramuscular Given 08/13/19 0055)  sodium chloride 0.9 % bolus 1,000 mL (1,000 mLs Intravenous New Bag/Given 08/13/19 0411)  sodium chloride 0.9 % bolus 500 mL (500 mLs Intravenous New Bag/Given 08/13/19 0412)  ondansetron (ZOFRAN) injection 4 mg (4 mg Intravenous Given 08/13/19 0431)  metoCLOPramide (REGLAN) injection 5 mg (5 mg  Intravenous Given 08/13/19 [id number])    ED Course  I have reviewed the triage vital signs and the nursing notes.  Pertinent labs & imaging results that were available during my care of the patient were reviewed by me and considered in my medical decision making (see chart for details).    MDM Rules/Calculators/A&P                      Patient states she has no veins left, I looked in her feet and did not see any big veins but there were some small veins.  We discussed trying to attempt getting IV access however if we are unable she will just be given medications IM and orally.  Nursing staff was able to get an IV in her left shoulder.  She was given medications for her withdrawal symptoms.  4:30 AM patient continues to complain of nausea and was given Zofran IV.  5 AM patient continued to complain of nausea, she was given Reglan 5 mg IV because the Zofran was not working.  Patient is extremely tiny and only weighs around 100 pounds..  Recheck at 6:00 AM patient is still getting her IV fluids, when I checked the IV line there is a kink where it joins the connection to the catheter.  I unkinked her lying and then it ran much better.  Patient states she is feeling better.  I talked to the officer at her bedside and they state that I could make recommendations of her treatment but they would not fill prescriptions at the jail.  Final Clinical Impression(s) / ED  Diagnoses Final diagnoses:  Heroin withdrawal ([place])  Dehydration  Nausea vomiting and diarrhea    Rx / DC Orders ED Discharge Orders    None     Plan discharge  Rolland Porter, MD, Barbette Or, MD 08/13/19 [phone]

## 2020-11-29 ENCOUNTER — Emergency Department (HOSPITAL_COMMUNITY)

## 2020-11-29 ENCOUNTER — Emergency Department (HOSPITAL_COMMUNITY)
Admission: EM | Admit: 2020-11-29 | Discharge: 2020-11-30 | Disposition: A | Discharge status: Home / Self Care | Attending: Emergency Medicine | Admitting: Emergency Medicine

## 2020-11-29 ENCOUNTER — Other Ambulatory Visit: Payer: Self-pay

## 2020-11-29 ENCOUNTER — Encounter (HOSPITAL_COMMUNITY): Payer: Self-pay | Admitting: Emergency Medicine

## 2020-11-29 DIAGNOSIS — J449 Chronic obstructive pulmonary disease, unspecified: Secondary | ICD-10-CM | POA: Diagnosis not present

## 2020-11-29 DIAGNOSIS — G510 Bell's palsy: Secondary | ICD-10-CM | POA: Diagnosis not present

## 2020-11-29 DIAGNOSIS — F17210 Nicotine dependence, cigarettes, uncomplicated: Secondary | ICD-10-CM | POA: Insufficient documentation

## 2020-11-29 DIAGNOSIS — Z8542 Personal history of malignant neoplasm of other parts of uterus: Secondary | ICD-10-CM | POA: Diagnosis not present

## 2020-11-29 DIAGNOSIS — R29810 Facial weakness: Secondary | ICD-10-CM | POA: Diagnosis present

## 2020-11-29 LAB — COMPREHENSIVE METABOLIC PANEL
ALT: 40 U/L (ref 0–44)
AST: 18 U/L (ref 15–41)
Albumin: 4.3 g/dL (ref 3.5–5.0)
Alkaline Phosphatase: 67 U/L (ref 38–126)
Anion gap: 6 (ref 5–15)
BUN: 23 mg/dL — ABNORMAL HIGH (ref 6–20)
CO2: 27 mmol/L (ref 22–32)
Calcium: 9.1 mg/dL (ref 8.9–10.3)
Chloride: 106 mmol/L (ref 98–111)
Creatinine, Ser: 0.59 mg/dL (ref 0.44–1.00)
GFR, Estimated: >60 mL/min (ref >60)
Glucose, Bld: 91 mg/dL (ref 70–99)
Potassium: 3.9 mmol/L (ref 3.5–5.1)
Sodium: 139 mmol/L (ref 135–145)
Total Bilirubin: 0.4 mg/dL (ref 0.3–1.2)
Total Protein: 7.9 g/dL (ref 6.5–8.1)

## 2020-11-29 LAB — CBC WITH DIFFERENTIAL/PLATELET
Abs Immature Granulocytes: 0.04 10*3/uL (ref 0.00–0.07)
Basophils Absolute: 0.1 10*3/uL (ref 0.0–0.1)
Basophils Relative: 1 %
Eosinophils Absolute: 0.4 10*3/uL (ref 0.0–0.5)
Eosinophils Relative: 6 %
HCT: 36.4 % (ref 36.0–46.0)
Hemoglobin: 12.1 g/dL (ref 12.0–15.0)
Immature Granulocytes: 1 %
Lymphocytes Relative: 40 %
Lymphs Abs: 2.7 10*3/uL (ref 0.7–4.0)
MCH: 32.4 pg (ref 26.0–34.0)
MCHC: 33.2 g/dL (ref 30.0–36.0)
MCV: 97.3 fL (ref 80.0–100.0)
Monocytes Absolute: 0.5 10*3/uL (ref 0.1–1.0)
Monocytes Relative: 7 %
Neutro Abs: 3.1 10*3/uL (ref 1.7–7.7)
Neutrophils Relative %: 45 %
Platelets: 321 10*3/uL (ref 150–400)
RBC: 3.74 MIL/uL — ABNORMAL LOW (ref 3.87–5.11)
RDW: 13.5 % (ref 11.5–15.5)
WBC: 6.7 10*3/uL (ref 4.0–10.5)
nRBC: 0 % (ref 0.0–0.2)

## 2020-11-29 MED ORDER — VALACYCLOVIR HCL 500 MG PO TABS
1000.0000 mg | ORAL_TABLET | Freq: Once | ORAL | Status: AC
  Administered 2020-11-30: 1000 mg via ORAL
  Filled 2020-11-29: qty 2

## 2020-11-29 MED ORDER — HYPROMELLOSE (GONIOSCOPIC) 2.5 % OP SOLN: OPHTHALMIC | 12 refills | Status: AC

## 2020-11-29 MED ORDER — PREDNISONE 20 MG PO TABS: ORAL_TABLET | ORAL | 0 refills | Status: AC

## 2020-11-29 MED ORDER — ARTIFICIAL TEARS OPHTHALMIC OINT: TOPICAL_OINTMENT | OPHTHALMIC | 1 refills | Status: AC

## 2020-11-29 MED ORDER — PREDNISONE 50 MG PO TABS
60.0000 mg | ORAL_TABLET | Freq: Once | ORAL | Status: AC
  Administered 2020-11-30: 60 mg via ORAL
  Filled 2020-11-29: qty 1

## 2020-11-29 MED ORDER — ARTIFICIAL TEARS OPHTHALMIC OINT
TOPICAL_OINTMENT | Freq: Once | OPHTHALMIC | Status: AC
  Filled 2020-11-29: qty 3.5

## 2020-11-29 MED ORDER — ACETAMINOPHEN 325 MG PO TABS
650.0000 mg | ORAL_TABLET | Freq: Once | ORAL | Status: AC
  Administered 2020-11-29: 650 mg via ORAL
  Filled 2020-11-29: qty 2

## 2020-11-29 MED ORDER — VALACYCLOVIR HCL 1 G PO TABS: ORAL_TABLET | ORAL | 0 refills | Status: AC

## 2020-11-29 NOTE — ED Provider Notes (Signed)
[facility] EMERGENCY DEPARTMENT Provider Note   CSN: [id number] Arrival date & time: 11/29/20  2137     History Chief Complaint  Patient presents with   Possible Stroke    Andrea Nelson is a 55 y.o. female.  Patient presents with inability to close her right eye and right facial droop for the last 9 hours.   Weakness     Past Medical History:  Diagnosis Date   Adenomatous polyp of colon 02/2010   Chronic abdominal pain    Chronic back pain    Colitis 11/2010   ?   COPD (chronic obstructive pulmonary disease) (HCC)    DDD (degenerative disc disease)    Lupus (HCC)    Nausea and vomiting    chronic, recurrent   Pancreatitis 2012   [name]   Polysubstance abuse [facility])    see admission 03/27/12   PUD (peptic ulcer disease)    Uterine cancer ([place]) 1996    Patient Active Problem List   Diagnosis Date Noted   Intractable nausea and vomiting 04/16/2015   Polysubstance abuse ([place])    Epigastric pain 09/19/2011   Persistent vomiting 09/19/2011   Dehydration 09/19/2011   History of systemic lupus erythematosus (SLE) ([place]) 09/19/2011    Past Surgical History:  Procedure Laterality Date   ABDOMINAL EXPLORATION SURGERY  1996   fibroid   ABDOMINAL HYSTERECTOMY  1996   w/ right salpingoopherectomy   APPENDECTOMY  1977   CESAREAN [place]   COLONOSCOPY W/ BIOPSIES  02/2010   Dr Christoper Fabian, Mikel Cella   ESOPHAGOGASTRODUODENOSCOPY  02/2010   Dr Donnamae Jude   ESOPHAGOGASTRODUODENOSCOPY  09/21/11   [place]-mild erosive esophagitis, small hiatal hernia, esophageal nodules   ESOPHAGOGASTRODUODENOSCOPY  09/20/2011   Procedure: ESOPHAGOGASTRODUODENOSCOPY (EGD);  Surgeon: Daneil Dolin, MD;  [facility];  Service: Endoscopy;  Laterality: N/A;   MULTIPLE EXTRACTIONS WITH ALVEOLOPLASTY  03/06/2012   Procedure: MULTIPLE EXTRACION WITH ALVEOLOPLASTY;  Surgeon: Gae Bon, DDS;  Location: [place];  Service: Oral Surgery;  Laterality: Bilateral;   PALATAL EXCISE, PALATAL LESIONS WITH BIOPSY   SHOULDER SURGERY       OB History     Gravida  5   Para  2   Term  1   Preterm  1   AB  3   Living         SAB  3   IAB      Ectopic      Multiple      Live Births              Family History  Problem Relation Age of Onset   Ulcers Mother    CAD Mother    Hypertension Mother    Colon cancer Maternal Grandmother    Cancer - Lung Father 25    Social History   Tobacco Use   Smoking status: Every Day    Packs/day: 1.00    Years: 25.00    Pack years: 25.00    Types: Cigarettes   Smokeless tobacco: Never  Substance Use Topics   Alcohol use: No   Drug use: Yes    Types: IV    Comment: heroin     Home Medications Prior to Admission medications   Medication Sig Start Date End Date Taking? Authorizing Provider  artificial tears (LACRILUBE) OINT ophthalmic ointment Put Lacri-Lube ointment in your right eye and tape it shut at night when you go to sleep 11/29/20  Yes Milton Ferguson, MD  hydroxypropyl methylcellulose / hypromellose (ISOPTO TEARS / GONIOVISC) 2.5 % ophthalmic solution Use 1 or 2 drops every couple hours in your right eye as needed for dryness 11/29/20  Yes Milton Ferguson, MD  predniSONE (DELTASONE) 20 MG tablet Take 3 pills once a day for 1 week 11/29/20  Yes Milton Ferguson, MD  valACYclovir (VALTREX) 1000 MG tablet Take 1 pill 3 times a day for a week 11/29/20  Yes Milton Ferguson, MD  albuterol (PROVENTIL HFA;VENTOLIN HFA) 108 (90 BASE) MCG/ACT inhaler Inhale 2 puffs into the lungs every 6 (six) hours as needed. Shortness of Breath    [provider]  HYDROcodone-acetaminophen (NORCO/VICODIN) 5-325 MG tablet 1 or 2 tabs PO q6 hours prn pain Patient not taking: Reported on 08/24/2015 04/14/15   Francine Graven, DO  methocarbamol (ROBAXIN) 500 MG tablet Take 2 tablets (1,000 mg total) by mouth 4 (four) times daily as needed for muscle spasms (muscle spasm/pain). Patient not taking: Reported on  08/24/2015 04/14/15   Francine Graven, DO  ondansetron (ZOFRAN ODT) 4 MG disintegrating tablet Take 1 tablet (4 mg total) by mouth every 8 (eight) hours as needed for nausea or vomiting. 08/24/15   Triplett, Tammy, PA-C  promethazine (PHENERGAN) 25 MG suppository Place 1 suppository (25 mg total) rectally every 6 (six) hours as needed for nausea or vomiting. Patient not taking: Reported on 08/24/2015 04/14/15   Francine Graven, DO    Allergies    Honey bee venom, Ibuprofen, Ketorolac tromethamine, Other, and Penicillins  Review of Systems   Review of Systems  Neurological:  Positive for weakness.   Physical Exam Updated Vital Signs BP (!) 110/57   Pulse 84   Temp 98.5 F (36.9 C) (Oral)   Resp 16   Ht '5\' 7"'$  (1.702 m)   Wt 59.1 kg   SpO2 99%   BMI 20.41 kg/m   Physical Exam  ED Results / Procedures / Treatments   Labs (all labs ordered are listed, but only abnormal results are displayed) Labs Reviewed  CBC WITH DIFFERENTIAL/PLATELET - Abnormal; Notable for the following components:      Result Value   RBC 3.74 (*)    All other components within normal limits  COMPREHENSIVE METABOLIC PANEL - Abnormal; Notable for the following components:   BUN 23 (*)    All other components within normal limits    EKG None  Radiology CT Head Wo Contrast  Result Date: 11/29/2020 CLINICAL DATA:  Headache and left facial droop EXAM: CT HEAD WITHOUT CONTRAST TECHNIQUE: Contiguous axial images were obtained from the base of the skull through the vertex without intravenous contrast. COMPARISON:  None. FINDINGS: Brain: There is no mass, hemorrhage or extra-axial collection. The size and configuration of the ventricles and extra-axial CSF spaces are normal. The brain parenchyma is normal, without acute or chronic infarction. Vascular: No abnormal hyperdensity of the major intracranial arteries or dural venous sinuses. No intracranial atherosclerosis. Skull: The visualized skull base, calvarium  and extracranial soft tissues are normal. Sinuses/Orbits: Right maxillary sinus mucosal thickening. The orbits are normal. IMPRESSION: 1. Normal brain. 2. Right maxillary sinus mucosal thickening. Electronically Signed   By: Ulyses Jarred M.D.   On: 11/29/2020 22:06    Procedures Procedures   Medications Ordered in ED Medications  predniSONE (DELTASONE) tablet 60 mg (has no administration in time range)  valACYclovir (VALTREX) tablet 1,000 mg (has no administration in time range)  acetaminophen (TYLENOL) tablet 650 mg (650 mg Oral Given 11/29/20 2237)  ED Course  I have reviewed the triage vital signs and the nursing notes.  Pertinent labs & imaging results that were available during my care of the patient were reviewed by me and considered in my medical decision making (see chart for details). CRITICAL CARE Performed by: Milton Ferguson Total critical care time: 86mnutes Critical care time was exclusive of separately billable procedures and treating other patients. Critical care was necessary to treat or prevent imminent or life-threatening deterioration. Critical care was time spent personally by me on the following activities: development of treatment plan with patient and/or surrogate as well as nursing, discussions with consultants, evaluation of patient's response to treatment, examination of patient, obtaining history from patient or surrogate, ordering and performing treatments and interventions, ordering and review of laboratory studies, ordering and review of radiographic studies, pulse oximetry and re-evaluation of patient's condition.    MDM Rules/Calculators/A&P                           Patient with Bell's palsy.  She is placed on Valtrex prednisone Lacri-Lube and artificial tears and referred to neurology Final Clinical Impression(s) / ED Diagnoses Final diagnoses:  Bell's palsy    Rx / DC Orders ED Discharge Orders          Ordered    valACYclovir (VALTREX) 1000  MG tablet        11/29/20 2331    predniSONE (DELTASONE) 20 MG tablet        11/29/20 2331    artificial tears (LACRILUBE) OINT ophthalmic ointment        11/29/20 2331    hydroxypropyl methylcellulose / hypromellose (ISOPTO TEARS / GONIOVISC) 2.5 % ophthalmic solution        11/29/20 2331             ZMilton Ferguson MD 12/01/20 1043

## 2020-11-29 NOTE — ED Notes (Signed)
Nira Conn, RN, [facility] notified of need for lacrilube from upstairs pharmacy

## 2020-11-29 NOTE — ED Notes (Signed)
Patient transported to CT 

## 2020-11-29 NOTE — ED Notes (Signed)
IV access attempted- no success- pt is difficult stick-Dr Zammit aware.

## 2020-11-29 NOTE — ED Notes (Signed)
Pt given crackers per request and Dr Roderic Palau ok

## 2020-11-29 NOTE — ED Triage Notes (Signed)
Pt [facility] for [facility]. Pt had c/o headache x 2 days and now presents with L sided facial drooping. LKW was 1130 this am. No other deficits identified.

## 2020-11-29 NOTE — Discharge Instructions (Addendum)
Follow-up with Dr. Merlene Laughter this week for recheck
